# Patient Record
Sex: Male | Born: 1975
Health system: Southern US, Community
[De-identification: ages and names within clinical notes are randomized; demographics above are authoritative.]

## PROBLEM LIST (undated history)

## (undated) DIAGNOSIS — R7989 Other specified abnormal findings of blood chemistry: Secondary | ICD-10-CM

## (undated) DIAGNOSIS — Z87891 Personal history of nicotine dependence: Secondary | ICD-10-CM

## (undated) DIAGNOSIS — J45909 Unspecified asthma, uncomplicated: Secondary | ICD-10-CM

## (undated) DIAGNOSIS — K760 Fatty (change of) liver, not elsewhere classified: Secondary | ICD-10-CM

## (undated) DIAGNOSIS — R945 Abnormal results of liver function studies: Secondary | ICD-10-CM

## (undated) DIAGNOSIS — E785 Hyperlipidemia, unspecified: Secondary | ICD-10-CM

## (undated) HISTORY — DX: Personal history of nicotine dependence: Z87.891

## (undated) HISTORY — DX: Unspecified asthma, uncomplicated: J45.909

## (undated) HISTORY — DX: Hyperlipidemia, unspecified: E78.5

## (undated) HISTORY — DX: Other specified abnormal findings of blood chemistry: R79.89

## (undated) HISTORY — DX: Abnormal results of liver function studies: R94.5

## (undated) HISTORY — DX: Fatty (change of) liver, not elsewhere classified: K76.0

---

## 2017-09-01 ENCOUNTER — Encounter (HOSPITAL_COMMUNITY)
Admission: EM | Disposition: A | Discharge status: Home / Self Care | Payer: Self-pay | Source: Home / Self Care | Attending: Emergency Medicine

## 2017-09-01 ENCOUNTER — Emergency Department (HOSPITAL_COMMUNITY): Payer: Commercial Managed Care - HMO | Admitting: Anesthesiology

## 2017-09-01 ENCOUNTER — Emergency Department (HOSPITAL_COMMUNITY): Payer: Commercial Managed Care - HMO

## 2017-09-01 ENCOUNTER — Observation Stay (HOSPITAL_COMMUNITY)
Admission: EM | Admit: 2017-09-01 | Discharge: 2017-09-02 | Disposition: A | Discharge status: Home / Self Care | Payer: Commercial Managed Care - HMO | Attending: Surgery | Admitting: Surgery

## 2017-09-01 ENCOUNTER — Encounter (HOSPITAL_COMMUNITY): Payer: Self-pay | Admitting: Emergency Medicine

## 2017-09-01 DIAGNOSIS — K3580 Unspecified acute appendicitis: Principal | ICD-10-CM | POA: Insufficient documentation

## 2017-09-01 DIAGNOSIS — Z79899 Other long term (current) drug therapy: Secondary | ICD-10-CM | POA: Diagnosis not present

## 2017-09-01 HISTORY — PX: LAPAROSCOPIC APPENDECTOMY: SHX408

## 2017-09-01 LAB — URINALYSIS, ROUTINE W REFLEX MICROSCOPIC
BILIRUBIN URINE: NEGATIVE
Glucose, UA: NEGATIVE mg/dL
HGB URINE DIPSTICK: NEGATIVE
Ketones, ur: 20 mg/dL — AB
Leukocytes, UA: NEGATIVE
Nitrite: NEGATIVE
Protein, ur: NEGATIVE mg/dL
pH: 7 (ref 5.0–8.0)

## 2017-09-01 LAB — COMPREHENSIVE METABOLIC PANEL
ALBUMIN: 4 g/dL (ref 3.5–5.0)
ALK PHOS: 58 U/L (ref 38–126)
ALT: 34 U/L (ref 0–44)
AST: 26 U/L (ref 15–41)
Anion gap: 12 (ref 5–15)
BUN: 14 mg/dL (ref 6–20)
CALCIUM: 9.2 mg/dL (ref 8.9–10.3)
CO2: 24 mmol/L (ref 22–32)
CREATININE: 1.19 mg/dL (ref 0.61–1.24)
Chloride: 102 mmol/L (ref 98–111)
GFR calc non Af Amer: >60 mL/min (ref >60)
GLUCOSE: 101 mg/dL — AB (ref 70–99)
Potassium: 3.8 mmol/L (ref 3.5–5.1)
SODIUM: 138 mmol/L (ref 135–145)
Total Bilirubin: 1.8 mg/dL — ABNORMAL HIGH (ref 0.3–1.2)
Total Protein: 6.9 g/dL (ref 6.5–8.1)

## 2017-09-01 LAB — CBC
HCT: 50.4 % (ref 39.0–52.0)
HEMOGLOBIN: 16.9 g/dL (ref 13.0–17.0)
MCH: 32.1 pg (ref 26.0–34.0)
MCHC: 33.5 g/dL (ref 30.0–36.0)
MCV: 95.6 fL (ref 78.0–100.0)
PLATELETS: 277 10*3/uL (ref 150–400)
RBC: 5.27 MIL/uL (ref 4.22–5.81)
RDW: 12.9 % (ref 11.5–15.5)
WBC: 10.1 10*3/uL (ref 4.0–10.5)

## 2017-09-01 LAB — LIPASE, BLOOD: Lipase: 27 U/L (ref 11–51)

## 2017-09-01 SURGERY — APPENDECTOMY, LAPAROSCOPIC
Anesthesia: General

## 2017-09-01 MED ORDER — SUCCINYLCHOLINE CHLORIDE 20 MG/ML IJ SOLN
INTRAMUSCULAR | Status: DC | PRN
  Administered 2017-09-01: 80 mg via INTRAVENOUS

## 2017-09-01 MED ORDER — SODIUM CHLORIDE 0.9 % IR SOLN
Status: DC | PRN
  Administered 2017-09-01: 1000 mL

## 2017-09-01 MED ORDER — SODIUM CHLORIDE 0.9 % IV SOLN
2.0000 g | Freq: Once | INTRAVENOUS | Status: AC
  Administered 2017-09-01: 2 g via INTRAVENOUS
  Filled 2017-09-01: qty 20

## 2017-09-01 MED ORDER — FENTANYL CITRATE (PF) 250 MCG/5ML IJ SOLN
INTRAMUSCULAR | Status: DC | PRN
  Administered 2017-09-01: 100 ug via INTRAVENOUS
  Administered 2017-09-01: 50 ug via INTRAVENOUS
  Administered 2017-09-02: 100 ug via INTRAVENOUS

## 2017-09-01 MED ORDER — PROPOFOL 10 MG/ML IV BOLUS
INTRAVENOUS | Status: DC | PRN
  Administered 2017-09-01: 160 mg via INTRAVENOUS
  Administered 2017-09-01: 40 mg via INTRAVENOUS

## 2017-09-01 MED ORDER — ROCURONIUM BROMIDE 100 MG/10ML IV SOLN
INTRAVENOUS | Status: DC | PRN
  Administered 2017-09-01: 40 mg via INTRAVENOUS
  Administered 2017-09-01: 10 mg via INTRAVENOUS

## 2017-09-01 MED ORDER — 0.9 % SODIUM CHLORIDE (POUR BTL) OPTIME
TOPICAL | Status: DC | PRN
  Administered 2017-09-01: 1000 mL

## 2017-09-01 MED ORDER — BUPIVACAINE-EPINEPHRINE (PF) 0.25% -1:200000 IJ SOLN
INTRAMUSCULAR | Status: AC
  Filled 2017-09-01: qty 30

## 2017-09-01 MED ORDER — MIDAZOLAM HCL 5 MG/5ML IJ SOLN
INTRAMUSCULAR | Status: DC | PRN
  Administered 2017-09-01: 2 mg via INTRAVENOUS

## 2017-09-01 MED ORDER — BUPIVACAINE-EPINEPHRINE 0.25% -1:200000 IJ SOLN
INTRAMUSCULAR | Status: DC | PRN
  Administered 2017-09-01: 10 mL

## 2017-09-01 MED ORDER — STERILE WATER FOR IRRIGATION IR SOLN
Status: DC | PRN
  Administered 2017-09-01: 1000 mL

## 2017-09-01 MED ORDER — LIDOCAINE HCL (CARDIAC) PF 100 MG/5ML IV SOSY
PREFILLED_SYRINGE | INTRAVENOUS | Status: DC | PRN
  Administered 2017-09-01: 60 mg via INTRATRACHEAL

## 2017-09-01 MED ORDER — ACETAMINOPHEN 500 MG PO TABS
1000.0000 mg | ORAL_TABLET | Freq: Once | ORAL | Status: AC
  Administered 2017-09-01: 1000 mg via ORAL

## 2017-09-01 MED ORDER — METRONIDAZOLE IN NACL 5-0.79 MG/ML-% IV SOLN
500.0000 mg | Freq: Once | INTRAVENOUS | Status: AC
Start: 2017-09-01 — End: 2017-09-01
  Administered 2017-09-01: 500 mg via INTRAVENOUS
  Filled 2017-09-01: qty 100

## 2017-09-01 MED ORDER — ACETAMINOPHEN 500 MG PO TABS
1000.0000 mg | ORAL_TABLET | Freq: Once | ORAL | Status: DC
  Filled 2017-09-01: qty 2

## 2017-09-01 MED ORDER — LACTATED RINGERS IV SOLN
INTRAVENOUS | Status: DC | PRN
  Administered 2017-09-01 – 2017-09-02 (×2): via INTRAVENOUS

## 2017-09-01 MED ORDER — FENTANYL CITRATE (PF) 250 MCG/5ML IJ SOLN
INTRAMUSCULAR | Status: AC
  Filled 2017-09-01: qty 5

## 2017-09-01 MED ORDER — IOHEXOL 300 MG/ML  SOLN
100.0000 mL | Freq: Once | INTRAMUSCULAR | Status: AC | PRN
  Administered 2017-09-01: 100 mL via INTRAVENOUS

## 2017-09-01 MED ORDER — SODIUM CHLORIDE 0.9 % IV BOLUS
1000.0000 mL | Freq: Once | INTRAVENOUS | Status: AC
Start: 2017-09-01 — End: 2017-09-01
  Administered 2017-09-01: 1000 mL via INTRAVENOUS

## 2017-09-01 MED ORDER — MIDAZOLAM HCL 2 MG/2ML IJ SOLN
INTRAMUSCULAR | Status: AC
  Filled 2017-09-01: qty 2

## 2017-09-01 MED ORDER — ACETAMINOPHEN 325 MG PO TABS: 325.0000 mg | ORAL_TABLET | Freq: Once | ORAL | Status: DC

## 2017-09-01 SURGICAL SUPPLY — 46 items
APPLIER CLIP ROT 10 11.4 M/L (STAPLE)
BENZOIN TINCTURE PRP APPL 2/3 (GAUZE/BANDAGES/DRESSINGS) ×3 IMPLANT
BLADE CLIPPER SURG (BLADE) IMPLANT
CANISTER SUCT 3000ML PPV (MISCELLANEOUS) IMPLANT
CHLORAPREP W/TINT 26ML (MISCELLANEOUS) ×3 IMPLANT
CLIP APPLIE ROT 10 11.4 M/L (STAPLE) IMPLANT
CLOSURE STERI-STRIP 1/2X4 (GAUZE/BANDAGES/DRESSINGS) ×1
CLOSURE WOUND 1/2 X4 (GAUZE/BANDAGES/DRESSINGS) ×1
CLSR STERI-STRIP ANTIMIC 1/2X4 (GAUZE/BANDAGES/DRESSINGS) ×2 IMPLANT
COVER SURGICAL LIGHT HANDLE (MISCELLANEOUS) ×3 IMPLANT
CUTTER FLEX LINEAR 45M (STAPLE) ×3 IMPLANT
DRSG TEGADERM 2-3/8X2-3/4 SM (GAUZE/BANDAGES/DRESSINGS) ×6 IMPLANT
DRSG TEGADERM 4X4.75 (GAUZE/BANDAGES/DRESSINGS) ×3 IMPLANT
ELECT REM PT RETURN 9FT ADLT (ELECTROSURGICAL) ×3
ELECTRODE REM PT RTRN 9FT ADLT (ELECTROSURGICAL) ×1 IMPLANT
ENDOLOOP SUT PDS II  0 18 (SUTURE)
ENDOLOOP SUT PDS II 0 18 (SUTURE) IMPLANT
FILTER SMOKE EVAC LAPAROSHD (FILTER) IMPLANT
GAUZE SPONGE 2X2 8PLY STRL LF (GAUZE/BANDAGES/DRESSINGS) ×1 IMPLANT
GLOVE BIO SURGEON STRL SZ7 (GLOVE) ×3 IMPLANT
GLOVE BIOGEL PI IND STRL 7.5 (GLOVE) ×1 IMPLANT
GLOVE BIOGEL PI INDICATOR 7.5 (GLOVE) ×2
GOWN STRL REUS W/ TWL LRG LVL3 (GOWN DISPOSABLE) ×3 IMPLANT
GOWN STRL REUS W/TWL LRG LVL3 (GOWN DISPOSABLE) ×6
HEMOSTAT SNOW SURGICEL 2X4 (HEMOSTASIS) ×3 IMPLANT
KIT BASIN OR (CUSTOM PROCEDURE TRAY) ×3 IMPLANT
KIT TURNOVER KIT B (KITS) ×3 IMPLANT
NS IRRIG 1000ML POUR BTL (IV SOLUTION) ×3 IMPLANT
PAD ARMBOARD 7.5X6 YLW CONV (MISCELLANEOUS) ×6 IMPLANT
POUCH SPECIMEN RETRIEVAL 10MM (ENDOMECHANICALS) ×3 IMPLANT
RELOAD STAPLE TA45 3.5 REG BLU (ENDOMECHANICALS) ×3 IMPLANT
SCISSORS ENDO CVD 5DCS (MISCELLANEOUS) IMPLANT
SET IRRIG TUBING LAPAROSCOPIC (IRRIGATION / IRRIGATOR) IMPLANT
SHEARS HARMONIC ACE PLUS 36CM (ENDOMECHANICALS) ×3 IMPLANT
SLEEVE ENDOPATH XCEL 5M (ENDOMECHANICALS) ×3 IMPLANT
SPECIMEN JAR SMALL (MISCELLANEOUS) ×3 IMPLANT
SPONGE GAUZE 2X2 STER 10/PKG (GAUZE/BANDAGES/DRESSINGS) ×2
STRIP CLOSURE SKIN 1/2X4 (GAUZE/BANDAGES/DRESSINGS) ×2 IMPLANT
SUT MNCRL AB 4-0 PS2 18 (SUTURE) ×3 IMPLANT
TOWEL OR 17X24 6PK STRL BLUE (TOWEL DISPOSABLE) ×3 IMPLANT
TOWEL OR 17X26 10 PK STRL BLUE (TOWEL DISPOSABLE) ×3 IMPLANT
TRAY LAPAROSCOPIC MC (CUSTOM PROCEDURE TRAY) ×3 IMPLANT
TROCAR XCEL BLUNT TIP 100MML (ENDOMECHANICALS) ×3 IMPLANT
TROCAR XCEL NON-BLD 5MMX100MML (ENDOMECHANICALS) ×3 IMPLANT
TUBING INSUFFLATION (TUBING) ×3 IMPLANT
WATER STERILE IRR 1000ML POUR (IV SOLUTION) ×3 IMPLANT

## 2017-09-01 NOTE — ED Triage Notes (Signed)
Patient complains of sharp right sided abdominal pain, emesis x2 days. Sent to ED from Urgent Care for concern of appendicitis. Patient alert, oriented, and in no apparent distress at this time.

## 2017-09-01 NOTE — ED Notes (Signed)
First surgeon said nothing by mouth but then he said he could have the tylenol.

## 2017-09-01 NOTE — ED Provider Notes (Signed)
[facility] Provider Note  CSN: [id number] Arrival date & time: 09/01/17  1348    History   Chief Complaint Chief Complaint  Patient presents with  . Abdominal Pain    HPI David Kent is a 42 y.o. male with no significant medical history who presented to the ED for abdominal pain x1 day. Describes acute onset sharp RLQ pain that has been constant. He cannot identify any aggravating or relieving symptoms. Endorses chills, diarrhea and vomiting which began 2 days ago before the abdominal pain. He denies any GI problems or surgeries in the past. Denies melena/hematochezia, constipation, unexpected weight change, flank pain, hematuria, dysuria, urgency or frequency.  History reviewed. No pertinent past medical history.  There are no active problems to display for this patient.   History reviewed. No pertinent surgical history.    Home Medications    Prior to Admission medications   Not on File    Family History No family history on file.  Social History Social History   Tobacco Use  . Smoking status: Not on file  Substance Use Topics  . Alcohol use: Not on file  . Drug use: Not on file    Allergies   Patient has no known allergies.   Review of Systems Review of Systems  Constitutional: Positive for chills. Negative for fever.  Respiratory: Negative.   Cardiovascular: Negative.   Gastrointestinal: Positive for abdominal pain, diarrhea, nausea and vomiting. Negative for blood in stool and constipation.  Genitourinary: Negative for decreased urine volume, difficulty urinating, dysuria, flank pain, frequency, hematuria and urgency.  Musculoskeletal: Negative.   Skin: Negative.   Neurological: Negative.   Hematological: Negative.   Psychiatric/Behavioral: Negative.    Physical Exam Updated Vital Signs BP (!) 126/91 (BP Location: Right Arm)   Pulse 75   Temp 98.6 F (37 C) (Oral)   Resp 14   SpO2 97%   Physical Exam    Constitutional: Vital signs are normal. He appears well-developed and well-nourished. He is cooperative.  Non-toxic appearance.  Cardiovascular: Normal rate, regular rhythm and normal heart sounds.  No murmur heard. Pulmonary/Chest: Effort normal and breath sounds normal.  Abdominal: Soft. Normal appearance and bowel sounds are normal. There is tenderness in the right lower quadrant. There is guarding and tenderness at McBurney's point.  Musculoskeletal: Normal range of motion.  Neurological: He is alert.  Skin: Skin is warm and intact. Capillary refill takes less than 2 seconds. He is not diaphoretic. No pallor.  Nursing note and vitals reviewed.  ED Treatments / Results  Labs (all labs ordered are listed, but only abnormal results are displayed) Labs Reviewed  COMPREHENSIVE METABOLIC PANEL - Abnormal; Notable for the following components:      Result Value   Glucose, Bld 101 (*)    Total Bilirubin 1.8 (*)    All other components within normal limits  LIPASE, BLOOD  CBC  URINALYSIS, ROUTINE W REFLEX MICROSCOPIC    EKG None  Radiology Ct Abdomen Pelvis W Contrast  Result Date: 09/01/2017 CLINICAL DATA:  Right flank pain EXAM: CT ABDOMEN AND PELVIS WITH CONTRAST TECHNIQUE: Multidetector CT imaging of the abdomen and pelvis was performed using the standard protocol following bolus administration of intravenous contrast. CONTRAST:  100mL OMNIPAQUE IOHEXOL 300 MG/ML  SOLN COMPARISON:  None. FINDINGS: Lower chest: Lung bases are clear. No effusions. Heart is normal size. Hepatobiliary: Diffuse low-density throughout the liver compatible with fatty infiltration. No focal abnormality. Gallbladder unremarkable. Pancreas: No focal abnormality or  ductal dilatation. Spleen: No focal abnormality.  Normal size. Adrenals/Urinary Tract: No adrenal abnormality. No focal renal abnormality. No stones or hydronephrosis. Urinary bladder is unremarkable. Stomach/Bowel: Dilated, inflamed appendix measuring  up to 14 mm with surrounding inflammation. Findings compatible with acute appendicitis. Appendix is retrocecal. Stomach, large and small bowel grossly unremarkable. Vascular/Lymphatic: No evidence of aneurysm or adenopathy. Reproductive: No visible focal abnormality. Other: No free fluid or free air. Musculoskeletal: No acute bony abnormality. IMPRESSION: Dilated and inflamed retrocecal appendix compatible with acute appendicitis. Fatty infiltration of the liver. Electronically Signed   By: Charlett NoseKevin  Dover M.D.   On: 09/01/2017 19:50    Procedures Procedures (including critical care time)  Medications Ordered in ED Medications  cefTRIAXone (ROCEPHIN) 2 g in sodium chloride 0.9 % 100 mL IVPB (has no administration in time range)    And  metroNIDAZOLE (FLAGYL) IVPB 500 mg (has no administration in time range)  sodium chloride 0.9 % bolus 1,000 mL (has no administration in time range)  iohexol (OMNIPAQUE) 300 MG/ML solution 100 mL (100 mLs Intravenous Contrast Given 09/01/17 1922)    Initial Impression / Assessment and Plan / ED Course  Triage vital signs and the nursing notes have been reviewed.  Pertinent labs & imaging results that were available during care of the patient were reviewed and considered in medical decision making (see chart for details).  Patient presents afebrile and with normal vital signs for RLQ abdominal pain. On physical exam, patient endorsed abdominal pain in his RLQ with some guarding, but had no other significant findings. Patient is well appearing and resting comfortably in the exam room. History is concerning for appendicitis.  Clinical Course as of Sep 02 2110  Thu Sep 01, 2017  1845 Labs unremarkable. No metabolic abnormalities. No elevated WBC   [GM]  2056 CT scan consistent with acute appendicitis. No free fluid or air in abdomen. No perforation. Consult placed to general surgery for admission. Ordered IV Rocephin and Flagyl for empiric antibiotic coverage.     [GM]  2109 Case discussed with general surgeon, Dr. Corliss Skainssuei, who will admit patient.   [GM]    Clinical Course User Index [GM] Anthony Roland, Sharyon MedicusGabrielle I, PA-C   Final Clinical Impressions(s) / ED Diagnoses  1. Acute Appendicitis. IV Rocephin and Flagyl initiated in the ED. Case discussed with general surgery who will admit.  Dispo: Admit.   Final diagnoses:  Acute appendicitis, unspecified acute appendicitis type    ED Discharge Orders    None        Reva BoresMortis, Irlene Crudup I, PA-C 09/01/17 2112    Pricilla LovelessGoldston, Scott, MD 09/01/17 2245

## 2017-09-01 NOTE — H&P (Signed)
David Kent is an 42 y.o. male.   Chief Complaint: RLQ pain HPI: 42 yo male in good health presents with one day of RLQ abdominal pain and two days of chills, diarrhea, and vomiting.  No fever.  Presented to ED for evaluation and was found to have appendicitis.  History reviewed. No pertinent past medical history.  History reviewed. No pertinent surgical history.  No family history on file. Social History:  has no tobacco, alcohol, and drug history on file.  Allergies: No Known Allergies  Prior to Admission medications   Medication Sig Start Date End Date Taking? Authorizing Provider  Cholecalciferol (VITAMIN D PO) Take 1 tablet by mouth daily.   Yes [provider]  FIBER ADULT GUMMIES PO Take 1 tablet by mouth daily.   Yes [provider]  Multiple Vitamin (MULTIVITAMIN WITH MINERALS) TABS tablet Take 1 tablet by mouth daily.   Yes [provider]  omega-3 acid ethyl esters (LOVAZA) 1 g capsule Take 1 g by mouth daily.   Yes [provider]     Results for orders placed or performed during the hospital encounter of 09/01/17 (from the past 48 hour(s))  Lipase, blood     Status: None   Collection Time: 09/01/17  2:55 PM  Result Value Ref Range   Lipase 27 11 - 51 U/L    Comment: Performed at [facility], [place] [address] [place], [place] [address]  Comprehensive metabolic panel     Status: Abnormal   Collection Time: 09/01/17  2:55 PM  Result Value Ref Range   Sodium 138 135 - 145 mmol/L   Potassium 3.8 3.5 - 5.1 mmol/L   Chloride 102 98 - 111 mmol/L   CO2 24 22 - 32 mmol/L   Glucose, Bld 101 (H) 70 - 99 mg/dL   BUN 14 6 - 20 mg/dL   Creatinine, Ser 1.19 0.61 - 1.24 mg/dL   Calcium 9.2 8.9 - 10.3 mg/dL   Total Protein 6.9 6.5 - 8.1 g/dL   Albumin 4.0 3.5 - 5.0 g/dL   AST 26 15 - 41 U/L   ALT 34 0 - 44 U/L   Alkaline Phosphatase 58 38 - 126 U/L   Total Bilirubin 1.8 (H) 0.3 - 1.2 mg/dL   GFR calc non Af Amer >60 >60 mL/min   GFR  calc Af Amer >60 >60 mL/min    Comment: (NOTE) The eGFR has been calculated using the CKD EPI equation. This calculation has not been validated in all clinical situations. eGFR's persistently <60 mL/min signify possible Chronic Kidney Disease.    Anion gap 12 5 - 15    Comment: Performed at [facility] [address], [place] [address]  CBC     Status: None   Collection Time: 09/01/17  2:55 PM  Result Value Ref Range   WBC 10.1 4.0 - 10.5 K/uL   RBC 5.27 4.22 - 5.81 MIL/uL   Hemoglobin 16.9 13.0 - 17.0 g/dL   HCT 50.4 39.0 - 52.0 %   MCV 95.6 78.0 - 100.0 fL   MCH 32.1 26.0 - 34.0 pg   MCHC 33.5 30.0 - 36.0 g/dL   RDW 12.9 11.5 - 15.5 %   Platelets 277 150 - 400 K/uL    Comment: Performed at [facility] [address] [place], [place] [address]   Ct Abdomen Pelvis W Contrast  Result Date: 09/01/2017 CLINICAL DATA:  Right flank pain EXAM: CT ABDOMEN  AND PELVIS WITH CONTRAST TECHNIQUE: Multidetector CT imaging of the abdomen and pelvis was performed using the standard protocol following bolus administration of intravenous contrast. CONTRAST:  124m OMNIPAQUE IOHEXOL 300 MG/ML  SOLN COMPARISON:  None. FINDINGS: Lower chest: Lung bases are clear. No effusions. Heart is normal size. Hepatobiliary: Diffuse low-density throughout the liver compatible with fatty infiltration. No focal abnormality. Gallbladder unremarkable. Pancreas: No focal abnormality or ductal dilatation. Spleen: No focal abnormality.  Normal size. Adrenals/Urinary Tract: No adrenal abnormality. No focal renal abnormality. No stones or hydronephrosis. Urinary bladder is unremarkable. Stomach/Bowel: Dilated, inflamed appendix measuring up to 14 mm with surrounding inflammation. Findings compatible with acute appendicitis. Appendix is retrocecal. Stomach, large and small bowel grossly unremarkable. Vascular/Lymphatic: No evidence of aneurysm or adenopathy. Reproductive: No visible focal abnormality. Other:  No free fluid or free air. Musculoskeletal: No acute bony abnormality. IMPRESSION: Dilated and inflamed retrocecal appendix compatible with acute appendicitis. Fatty infiltration of the liver. Electronically Signed   By: KRolm BaptiseM.D.   On: 09/01/2017 19:50    Review of Systems  Constitutional: Negative for weight loss.  HENT: Negative for ear discharge, ear pain, hearing loss and tinnitus.   Eyes: Negative for blurred vision, double vision, photophobia and pain.  Respiratory: Negative for cough, sputum production and shortness of breath.   Cardiovascular: Negative for chest pain.  Gastrointestinal: Positive for abdominal pain, diarrhea, nausea and vomiting.  Genitourinary: Negative for dysuria, flank pain, frequency and urgency.  Musculoskeletal: Negative for back pain, falls, joint pain, myalgias and neck pain.  Neurological: Negative for dizziness, tingling, sensory change, focal weakness, loss of consciousness and headaches.  Endo/Heme/Allergies: Does not bruise/bleed easily.  Psychiatric/Behavioral: Negative for depression, memory loss and substance abuse. The patient is not nervous/anxious.     Blood pressure (!) 126/91, pulse 75, temperature 98.6 F (37 C), temperature source Oral, resp. rate 14, SpO2 97 %. Physical Exam  WDWN in NAD Eyes:  Pupils equal, round; sclera anicteric HENT:  Oral mucosa moist; good dentition  Neck:  No masses palpated, no thyromegaly Lungs:  CTA bilaterally; normal respiratory effort CV:  Regular rate and rhythm; no murmurs; extremities well-perfused with no edema Abd:  +bowel sounds, soft, tender in RLQ with some guarding, no palpable organomegaly; no palpable hernias Skin:  Warm, dry; no sign of jaundice Psychiatric - alert and oriented x 4; calm mood and affect  Assessment/Plan Acute appendicitis  - no sign of perforation.  To OR for laparoscopic appendectomy.  The surgical procedure has been discussed with the patient.  Potential risks,  benefits, alternative treatments, and expected outcomes have been explained.  All of the patient's questions at this time have been answered.  The likelihood of reaching the patient's treatment goal is good.  The patient understand the proposed surgical procedure and wishes to proceed.   MMaia Petties MD 09/01/2017, 9:55 PM

## 2017-09-01 NOTE — Anesthesia Preprocedure Evaluation (Signed)
Anesthesia Evaluation  Patient identified by MRN, date of birth, ID band Patient awake    Reviewed: Allergy & Precautions, NPO status , Patient's Chart, lab work & pertinent test results  Airway Mallampati: III  TM Distance: >3 FB Neck ROM: Full    Dental  (+) Teeth Intact   Pulmonary neg pulmonary ROS,    breath sounds clear to auscultation       Cardiovascular negative cardio ROS   Rhythm:Regular     Neuro/Psych negative neurological ROS  negative psych ROS   GI/Hepatic Neg liver ROS, appendicitis   Endo/Other  negative endocrine ROS  Renal/GU negative Renal ROS     Musculoskeletal negative musculoskeletal ROS (+)   Abdominal   Peds  Hematology negative hematology ROS (+)   Anesthesia Other Findings   Reproductive/Obstetrics                             Anesthesia Physical Anesthesia Plan  ASA: I  Anesthesia Plan: General   Post-op Pain Management:    Induction: Intravenous, Rapid sequence and Cricoid pressure planned  PONV Risk Score and Plan: 2 and Ondansetron and Dexamethasone  Airway Management Planned: Oral ETT  Additional Equipment: None  Intra-op Plan:   Post-operative Plan: Extubation in OR  Informed Consent: I have reviewed the patients History and Physical, chart, labs and discussed the procedure including the risks, benefits and alternatives for the proposed anesthesia with the patient or authorized representative who has indicated his/her understanding and acceptance.   Dental advisory given  Plan Discussed with: CRNA and Surgeon  Anesthesia Plan Comments:         Anesthesia Quick Evaluation

## 2017-09-01 NOTE — Anesthesia Procedure Notes (Signed)
Procedure Name: Intubation Date/Time: 09/01/2017 11:51 PM Performed by: Claudina LickMahony, Nigil Braman D, CRNA Pre-anesthesia Checklist: Patient identified, Emergency Drugs available, Suction available, Patient being monitored and Timeout performed Patient Re-evaluated:Patient Re-evaluated prior to induction Oxygen Delivery Method: Circle system utilized Preoxygenation: Pre-oxygenation with 100% oxygen Induction Type: IV induction, Rapid sequence and Cricoid Pressure applied Laryngoscope Size: Miller and 2 Grade View: Grade I Tube type: Oral Tube size: 7.5 mm Number of attempts: 2 (First DL- Pt coughing.  Second DL grade 1 view, oropharynx clear.) Airway Equipment and Method: Stylet Placement Confirmation: ETT inserted through vocal cords under direct vision,  positive ETCO2 and breath sounds checked- equal and bilateral Secured at: 22 cm Tube secured with: Tape Dental Injury: Teeth and Oropharynx as per pre-operative assessment

## 2017-09-02 ENCOUNTER — Other Ambulatory Visit: Payer: Self-pay

## 2017-09-02 ENCOUNTER — Encounter (HOSPITAL_COMMUNITY): Payer: Self-pay | Admitting: *Deleted

## 2017-09-02 DIAGNOSIS — K3580 Unspecified acute appendicitis: Secondary | ICD-10-CM | POA: Diagnosis present

## 2017-09-02 MED ORDER — METRONIDAZOLE IN NACL 5-0.79 MG/ML-% IV SOLN
500.0000 mg | Freq: Three times a day (TID) | INTRAVENOUS | Status: DC
  Administered 2017-09-02: 500 mg via INTRAVENOUS
  Filled 2017-09-02 (×2): qty 100

## 2017-09-02 MED ORDER — OXYCODONE HCL 5 MG PO TABS: 5.0000 mg | ORAL_TABLET | Freq: Once | ORAL | Status: DC | PRN

## 2017-09-02 MED ORDER — ONDANSETRON HCL 4 MG/2ML IJ SOLN
INTRAMUSCULAR | Status: DC | PRN
  Administered 2017-09-02: 4 mg via INTRAVENOUS

## 2017-09-02 MED ORDER — FENTANYL CITRATE (PF) 100 MCG/2ML IJ SOLN
INTRAMUSCULAR | Status: AC
  Filled 2017-09-02: qty 2

## 2017-09-02 MED ORDER — OXYCODONE HCL 5 MG PO TABS: 5.0000 mg | ORAL_TABLET | ORAL | 0 refills | Status: DC | PRN

## 2017-09-02 MED ORDER — DEXAMETHASONE SODIUM PHOSPHATE 10 MG/ML IJ SOLN
INTRAMUSCULAR | Status: DC | PRN
  Administered 2017-09-02: 10 mg via INTRAVENOUS

## 2017-09-02 MED ORDER — MORPHINE SULFATE (PF) 2 MG/ML IV SOLN: 2.0000 mg | INTRAVENOUS | Status: DC | PRN

## 2017-09-02 MED ORDER — ONDANSETRON HCL 4 MG/2ML IJ SOLN: 4.0000 mg | Freq: Four times a day (QID) | INTRAMUSCULAR | Status: DC | PRN

## 2017-09-02 MED ORDER — DIPHENHYDRAMINE HCL 25 MG PO CAPS: 25.0000 mg | ORAL_CAPSULE | Freq: Four times a day (QID) | ORAL | Status: DC | PRN

## 2017-09-02 MED ORDER — ACETAMINOPHEN 500 MG PO TABS: 1000.0000 mg | ORAL_TABLET | Freq: Four times a day (QID) | ORAL | 0 refills | Status: DC | PRN

## 2017-09-02 MED ORDER — SODIUM CHLORIDE 0.9 % IV SOLN: 2.0000 g | INTRAVENOUS | Status: DC

## 2017-09-02 MED ORDER — OXYCODONE HCL 5 MG PO TABS
5.0000 mg | ORAL_TABLET | ORAL | Status: DC | PRN
  Administered 2017-09-02: 10 mg via ORAL
  Filled 2017-09-02: qty 2

## 2017-09-02 MED ORDER — TRAMADOL HCL 50 MG PO TABS: 50.0000 mg | ORAL_TABLET | Freq: Four times a day (QID) | ORAL | Status: DC | PRN

## 2017-09-02 MED ORDER — OXYCODONE HCL 5 MG/5ML PO SOLN: 5.0000 mg | Freq: Once | ORAL | Status: DC | PRN

## 2017-09-02 MED ORDER — IBUPROFEN 200 MG PO TABS: 200.0000 mg | ORAL_TABLET | Freq: Three times a day (TID) | ORAL | 0 refills | Status: DC | PRN

## 2017-09-02 MED ORDER — FENTANYL CITRATE (PF) 100 MCG/2ML IJ SOLN
25.0000 ug | INTRAMUSCULAR | Status: DC | PRN
  Administered 2017-09-02 (×2): 50 ug via INTRAVENOUS

## 2017-09-02 MED ORDER — POTASSIUM CHLORIDE IN NACL 20-0.9 MEQ/L-% IV SOLN
INTRAVENOUS | Status: DC
  Administered 2017-09-02: 02:00:00 via INTRAVENOUS
  Filled 2017-09-02: qty 1000

## 2017-09-02 MED ORDER — GABAPENTIN 300 MG PO CAPS
300.0000 mg | ORAL_CAPSULE | Freq: Two times a day (BID) | ORAL | Status: DC
  Administered 2017-09-02 (×2): 300 mg via ORAL
  Filled 2017-09-02 (×2): qty 1

## 2017-09-02 MED ORDER — SUGAMMADEX SODIUM 200 MG/2ML IV SOLN
INTRAVENOUS | Status: DC | PRN
  Administered 2017-09-02: 300 mg via INTRAVENOUS

## 2017-09-02 MED ORDER — DIPHENHYDRAMINE HCL 50 MG/ML IJ SOLN: 25.0000 mg | Freq: Four times a day (QID) | INTRAMUSCULAR | Status: DC | PRN

## 2017-09-02 MED ORDER — ACETAMINOPHEN 500 MG PO TABS
1000.0000 mg | ORAL_TABLET | Freq: Four times a day (QID) | ORAL | Status: DC
  Administered 2017-09-02: 1000 mg via ORAL
  Filled 2017-09-02: qty 2

## 2017-09-02 MED ORDER — ONDANSETRON 4 MG PO TBDP: 4.0000 mg | ORAL_TABLET | Freq: Four times a day (QID) | ORAL | Status: DC | PRN

## 2017-09-02 NOTE — Transfer of Care (Signed)
Immediate Anesthesia Transfer of Care Note  Patient: David Kent  Procedure(s) Performed: APPENDECTOMY LAPAROSCOPIC (N/A )  Patient Location: PACU  Anesthesia Type:General  Level of Consciousness: awake  Airway & Oxygen Therapy: Patient Spontanous Breathing  Post-op Assessment: Report given to RN and Post -op Vital signs reviewed and stable  Post vital signs: Reviewed and stable  Last Vitals:  Vitals Value Taken Time  BP    Temp    Pulse 87 09/02/2017 12:43 AM  Resp    SpO2 96 % 09/02/2017 12:43 AM  Vitals shown include unvalidated device data.  Last Pain:  Vitals:   09/01/17 1738  TempSrc:   PainSc: 5          Complications: No apparent anesthesia complications

## 2017-09-02 NOTE — Progress Notes (Addendum)
Received patient from PACU, AOx4, VSS, O2Sat at 93% on RA, pain at 4/10.  Patient oriented to room, bed controls and call light and gave ice water to drink, then patient ambulated to bathroom to void.  Gave scheduled tylenol and neurontin.  Patient now lying on bed comfortably watching TV.  Will monitor.

## 2017-09-02 NOTE — Progress Notes (Signed)
Discharge home. Home discharge instruction given, no question verbalized. 

## 2017-09-02 NOTE — Op Note (Signed)
Appendectomy, Lap, Procedure Note  Indications: The patient presented with a history of right-sided abdominal pain. A CT scan revealed findings consistent with acute appendicitis.  Pre-operative Diagnosis: Acute appendicitis without mention of peritonitis  Post-operative Diagnosis: Same  Surgeon: Wynona LunaMatthew K Galia Rahm   Assistants: none  Anesthesia: General endotracheal anesthesia  ASA Class: 1E  Procedure Details  The patient was seen again in the Holding Room. The risks, benefits, complications, treatment options, and expected outcomes were discussed with the patient and/or family. The possibilities of reaction to medication, perforation of viscus, bleeding, recurrent infection, finding a normal appendix, the need for additional procedures, failure to diagnose a condition, and creating a complication requiring transfusion or operation were discussed. There was concurrence with the proposed plan and informed consent was obtained. The site of surgery was properly noted. The patient was taken to Operating Room, [facility] and the procedure verified as Appendectomy. A Time Out was held and the above information confirmed.  The patient was placed in the supine position and general anesthesia was induced.  The abdomen was prepped and draped in a sterile fashion. A one centimeter supraumbilical incision was made.  Dissection was carried down to the fascia bluntly.  The fascia was incised vertically.  We entered the peritoneal cavity bluntly.  A pursestring suture was passed around the incision with a 0 Vicryl.  The Hasson cannula was introduced into the abdomen and the tails of the suture were used to hold the Hasson in place.   The pneumoperitoneum was then established maintaining a maximum pressure of 15 mmHg.  Additional 5 mm cannulas then placed in the left lower quadrant of the abdomen and the right upper quadrant under direct visualization. A careful evaluation of the entire abdomen  was carried out. The patient was placed in Trendelenburg and left lateral decubitus position.  The scope was moved to the right upper quadrant port site. The cecum was mobilized medially.  The appendix was in a retrocecal location.  There was significant inflammation and some fibrinous exudate, but no abscess and no sign of perforation.  The appendix was carefully dissected. The appendix was was skeletonized with the harmonic scalpel.   The appendix was divided at its base using an endo-GIA stapler. Minimal appendiceal stump was left in place. A small bleeding vessel was controlled with a Ligaclip and Surgicel.  There was no further evidence of bleeding, leakage, or complication after division of the appendix. Irrigation was also performed and irrigate suctioned from the abdomen as well.  The umbilical port site was closed with the purse string suture. There was no residual palpable fascial defect.  The trocar site skin wounds were closed with 4-0 Monocryl.  Instrument, sponge, and needle counts were correct at the conclusion of the case.   Findings: The appendix was found to be inflamed. There were not signs of necrosis.  There was not perforation. There was not abscess formation.  Estimated Blood Loss:  less than 50 mL         Drains: none         Specimens: appendix         Complications:  None; patient tolerated the procedure well.         Disposition: PACU - hemodynamically stable.         Condition: stable  Wilmon ArmsMatthew K. Corliss Skainssuei, MD, [facility]  General/ Trauma Surgery  09/02/2017 12:35 AM

## 2017-09-02 NOTE — Discharge Summary (Signed)
     Patient ID: David BattyLuis Hudgins [id number] 05/04/1975 42 y.o.  Admit date: 09/01/2017 Discharge date: 09/02/2017  Admitting Diagnosis: Acute appendicitis   Discharge Diagnosis Patient Active Problem List   Diagnosis Date Noted  . Acute appendicitis 09/02/2017    Consultants none  Reason for Admission: 42 yo male in good health presents with one day of RLQ abdominal pain and two days of chills, diarrhea, and vomiting.  No fever.  Presented to ED for evaluation and was found to have appendicitis.  Procedures Lap appy by Dr. Corliss Skainssuei, [facility]8/16  Hospital Course:  The patient was admitted for a lap appy.  He underwent the procedure and tolerated it well.  On POD 0, he was tolerating a regular diet, good pain control, voiding well, and mobilizing well.  He was otherwise stable for DC home.    Physical Exam: Abd: soft, appropriately tender, +BS, incisions c/d/i with steri-strips, gauze, and tegaderm.    Allergies as of 09/02/2017   No Known Allergies     Medication List    TAKE these medications   acetaminophen 500 MG tablet Commonly known as:  TYLENOL Take 2 tablets (1,000 mg total) by mouth every 6 (six) hours as needed.   FIBER ADULT GUMMIES PO Take 1 tablet by mouth daily.   ibuprofen 200 MG tablet Commonly known as:  ADVIL,MOTRIN Take 1-4 tablets (200-800 mg total) by mouth every 8 (eight) hours as needed.   multivitamin with minerals Tabs tablet Take 1 tablet by mouth daily.   omega-3 acid ethyl esters 1 g capsule Commonly known as:  LOVAZA Take 1 g by mouth daily.   oxyCODONE 5 MG immediate release tablet Commonly known as:  Oxy IR/ROXICODONE Take 1 tablet (5 mg total) by mouth every 4 (four) hours as needed for moderate pain.   VITAMIN D PO Take 1 tablet by mouth daily.        Follow-up Information    [facility] 2 week(s).   Specialty:  General Surgery Contact information: [address] [place]  [id number] [phone]           Signed: Barnetta ChapelKelly Hawley Pavia, [facility] 09/02/2017, 7:51 AM Pager: [phone]

## 2017-09-02 NOTE — Anesthesia Postprocedure Evaluation (Signed)
Anesthesia Post Note  Patient: Koi Hinderer  Procedure(s) Performed: APPENDECTOMY LAPAROSCOPIC (N/A )     Patient location during evaluation: PACU Anesthesia Type: General Level of consciousness: awake and alert Pain management: pain level controlled Vital Signs Assessment: post-procedure vital signs reviewed and stable Respiratory status: spontaneous breathing, nonlabored ventilation, respiratory function stable and patient connected to nasal cannula oxygen Cardiovascular status: blood pressure returned to baseline and stable Postop Assessment: no apparent nausea or vomiting Anesthetic complications: no    Last Vitals:  Vitals:   09/02/17 0203 09/02/17 0502  BP: 125/81 131/77  Pulse: 67 (!) 57  Resp: 18 17  Temp: 36.9 C 36.8 C  SpO2: 93% 95%    Last Pain:  Vitals:   09/02/17 0502  TempSrc: Oral  PainSc:                  Maiah Sinning

## 2017-09-02 NOTE — Discharge Instructions (Signed)
Please arrive at least 30 min before your appointment to complete your check in paperwork.  If you are unable to arrive 30 min prior to your appointment time we may have to cancel or reschedule you.  LAPAROSCOPIC SURGERY: POST OP INSTRUCTIONS  1. DIET: Follow a light bland diet the first 24 hours after arrival home, such as soup, liquids, crackers, etc. Be sure to include lots of fluids daily. Avoid fast food or heavy meals as your are more likely to get nauseated. Eat a low fat the next few days after surgery.  2. Take your usually prescribed home medications unless otherwise directed. 3. PAIN CONTROL:  1. Pain is best controlled by a usual combination of three different methods TOGETHER:  i. Ice/Heat ii. Over the counter pain medication iii. Prescription pain medication 2. Most patients will experience some swelling and bruising around the incisions. Ice packs or heating pads (30-60 minutes up to 6 times a day) will help. Use ice for the first few days to help decrease swelling and bruising, then switch to heat to help relax tight/sore spots and speed recovery. Some people prefer to use ice alone, heat alone, alternating between ice & heat. Experiment to what works for you. Swelling and bruising can take several weeks to resolve.  3. It is helpful to take an over-the-counter pain medication regularly for the first few weeks. Choose one of the following that works best for you:  i. Naproxen (Aleve, etc) Two 220mg  tabs twice a day ii. Ibuprofen (Advil, etc) Three 200mg  tabs four times a day (every meal & bedtime) iii. Acetaminophen (Tylenol, etc) 500-650mg  four times a day (every meal & bedtime) 4. A prescription for pain medication (such as oxycodone, hydrocodone, etc) should be given to you upon discharge. Take your pain medication as prescribed.  i. If you are having problems/concerns with the prescription medicine (does not control pain, nausea, vomiting, rash, itching, etc), please call us [phone]  [phone] to see if we need to switch you to a different pain medicine that will work better for you and/or control your side effect better. ii. If you need a refill on your pain medication, please contact your pharmacy. They will contact our office to request authorization. Prescriptions will not be filled after 5 pm or on week-ends. 1. Avoid getting constipated. Between the surgery and the pain medications, it is common to experience some constipation. Increasing fluid intake and taking a fiber supplement (such as Metamucil, Citrucel, FiberCon, MiraLax, etc) 1-2 times a day regularly will usually help prevent this problem from occurring. A mild laxative (prune juice, Milk of Magnesia, MiraLax, etc) should be taken according to package directions if there are no bowel movements after 48 hours.  2. Watch out for diarrhea. If you have many loose bowel movements, simplify your diet to bland foods & liquids for a few days. Stop any stool softeners and decrease your fiber supplement. Switching to mild anti-diarrheal medications (Kayopectate, Pepto Bismol) can help. If this worsens or does not improve, please call us. 3. Wash / shower every day. You may shower over the dressings as they are waterproof. Continue to shower over incision(s) after the dressing is off. 4. Remove your waterproof bandages 5 days after surgery. You may leave the incision open to air. You may replace a dressing/Band-Aid to cover the incision for comfort if you wish.  5. ACTIVITIES as tolerated:  a. You may resume regular (light) daily activities beginning the next day--such as daily self-care, walking, climbing stairs--gradually  increasing activities as tolerated. If you can walk 30 minutes without difficulty, it is safe to try more intense activity such as jogging, treadmill, bicycling, low-impact aerobics, swimming, etc. °b. Save the most intensive and strenuous activity for last such as sit-ups, heavy lifting, contact sports, etc Refrain  from any heavy lifting or straining until you are off narcotics for pain control.  °c. DO NOT PUSH THROUGH PAIN. Let pain be your guide: If it hurts to do something, don't do it. Pain is your body warning you to avoid that activity for another week until the pain goes down. °d. You may drive when you are no longer taking prescription pain medication, you can comfortably wear a seatbelt, and you can safely maneuver your car and apply brakes. °e. You may have sexual intercourse when it is comfortable.  °6. FOLLOW UP in our office  °a. Please call [facility] at [phone] to set up an appointment to see your surgeon in the office for a follow-up appointment approximately 2-3 weeks after your surgery. °b. Make sure that you call for this appointment the day you arrive home to insure a convenient appointment time. °     10. IF YOU HAVE DISABILITY OR FAMILY LEAVE FORMS, BRING THEM TO THE               OFFICE FOR PROCESSING.  ° °WHEN TO CALL US [phone]:  °1. Poor pain control °2. Reactions / problems with new medications (rash/itching, nausea, etc)  °3. Fever over 101.5 F (38.5 C) °4. Inability to urinate °5. Nausea and/or vomiting °6. Worsening swelling or bruising °7. Continued bleeding from incision. °8. Increased pain, redness, or drainage from the incision ° °The clinic staff is available to answer your questions during regular business hours (8:30am-5pm). Please don’t hesitate to call and ask to speak to one of our nurses for clinical concerns.  °If you have a medical emergency, go to the nearest emergency room or call 911.  °A surgeon from [facility] is always on call at the hospitals  ° °[facility]  °[address], [address], [place], [place] [address] ?  °MAIN: [phone] ? TOLL FREE: [phone] ?  °FAX [phone]  °[email or web address] ° °

## 2017-09-02 NOTE — Progress Notes (Signed)
Patient's wallet and phone retrieved from security and returned to patient in PACU.  Witnessed by patient and Publishing copyAngleo Brickhouse RN.

## 2017-09-05 NOTE — Progress Notes (Signed)
Subjective:    Patient ID: David Kent, male    DOB: 07/01/1975, 42 y.o.   MRN: [id number]  HPI Chief Complaint  Patient presents with  . new pt    new pt,physical, cholesterol- had appendix out friday. will come back to get flu shot   He is new to the practice and here for a complete physical exam. [place] 2 [place]  Last CPE: 2 years ago  Other providers: None   Past medical history: History of elevated cholesterol, no medications.  Recent weight gain due to poor diet and no exercise.   Surgeries: recent appendectomy done by Dr. Corliss Skainssuei. States he is doing well but abdomen is still sore.   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  Former smoker and stopped 15 years ago.  Reports he has been drinking more alcohol than usual but is cutting back.  Denies drug use.   Diet: Diet has been poor. Fast food.  Exercise: nothing lately   Immunizations: Tdap unknown   Depression screen [facility] 2[facility] 09/06/2017  Decreased Interest 0  Down, Depressed, Hopeless 0  PHQ - 2 Score 0     Health maintenance:  Colonoscopy: N/A Last PSA: N/A Last Dental Exam: one year ago Last Eye Exam: 2 years ago   Wears seatbelt always, uses sunscreen, smoke detectors in home and functioning, does not text while driving, feels safe in home environment.  Reviewed allergies, medications, past medical, surgical, family, and social history.   Review of Systems Review of Systems Constitutional: -fever, -chills, -sweats, -unexpected weight change,-fatigue ENT: -runny nose, -ear pain, -sore throat Cardiology:  -chest pain, -palpitations, -edema Respiratory: -cough, -shortness of breath, -wheezing Gastroenterology: -abdominal pain, -nausea, -vomiting, -diarrhea, -constipation  Hematology: -bleeding or bruising problems Musculoskeletal: -arthralgias, -myalgias, -joint swelling, -back pain Ophthalmology:  -vision changes Urology: -dysuria, -difficulty urinating, -hematuria, -urinary frequency, -urgency Neurology: -headache, -weakness, -tingling, -numbness       Objective:   Physical Exam BP 120/70   Pulse 74   Temp 98.2 F (36.8 C) (Oral)   Ht 5\' 10"  (1.778 m)   Wt 221 lb (100.2 kg)   BMI 31.71 kg/m   General Appearance:    Alert, cooperative, no distress, appears stated age  Head:    Normocephalic, without obvious abnormality, atraumatic  Eyes:    PERRL, conjunctiva/corneas clear, EOM's intact, fundi    benign  Ears:    Normal TM's and external ear canals  Nose:   Nares normal, mucosa normal, no drainage or sinus   tenderness  Throat:   Lips, mucosa, and tongue normal; teeth and gums normal  Neck:   Supple, no lymphadenopathy;  thyroid:  no   enlargement/tenderness/nodules; no carotid   bruit or JVD  Back:    Spine nontender, no curvature, ROM normal, no CVA     tenderness  Lungs:     Clear to auscultation bilaterally without wheezes, rales or     ronchi; respirations unlabored  Chest Wall:    No tenderness or deformity   Heart:    Regular rate and rhythm, S1 and S2 normal, no murmur, rub   or gallop  Breast Exam:    No chest wall tenderness, masses or gynecomastia  Abdomen:     Soft, non-tender, nondistended, normoactive bowel sounds,    no masses, no hepatosplenomegaly. Incisions with steri strips intact. No sign of infection.   Genitalia:  Normal male external genitalia without lesions.  Testicles without masses.  No inguinal hernias.     Extremities:   No clubbing, cyanosis or edema  Pulses:   2+ and symmetric all extremities  Skin:   Skin color, texture, turgor normal,   Lymph nodes:   Cervical, supraclavicular, and axillary nodes normal  Neurologic:   CNII-XII intact, normal strength, sensation and gait; reflexes 2+ and symmetric throughout          Psych:   Normal mood, affect, hygiene and grooming.    Urinalysis dipstick: negative       Assessment & Plan:    Routine general medical examination at a health care facility - Plan: POCT Urinalysis DIP (Proadvantage Device), CBC with Differential/Platelet, Comprehensive metabolic panel, TSH, T4, free, Lipid panel  Hyperlipidemia, unspecified hyperlipidemia type - Plan: TSH, T4, free, Lipid panel  Obesity (BMI 30.0-34.9) - Plan: CBC with Differential/Platelet, Comprehensive metabolic panel, TSH, T4, free, Lipid panel  Family history of thyroid disease in mother - Plan: TSH, T4, free  Vaccine counseling  In good spirits. Motivated to make healthy lifestyle changes. Apparently he has been drinking a significant amount of alcohol and eating poorly. No recent exercise.  Counseling done on healthy diet, exercise and taking better care of himself.  Advised to stop alcohol use or to cut back.  Weight gain explainable per patient.  History of hyperlipidemia and will recheck lipids today.  Declines STD testing.  Declines Tdap and influenza. He will return. Counseling done on vaccines.  Visual acuity is ok.  Eczema and plans to schedule with dermatology for this. Has medication at home.  Will schedule with a dentist and a list was given.  Follow up pending labs.

## 2017-09-06 ENCOUNTER — Ambulatory Visit: Payer: 59 | Admitting: Family Medicine

## 2017-09-06 ENCOUNTER — Encounter: Payer: Self-pay | Admitting: Family Medicine

## 2017-09-06 VITALS — BP 120/70 | HR 74 | Temp 98.2°F | Ht 70.0 in | Wt 221.0 lb

## 2017-09-06 DIAGNOSIS — Z0000 Encounter for general adult medical examination without abnormal findings: Secondary | ICD-10-CM | POA: Diagnosis not present

## 2017-09-06 DIAGNOSIS — E669 Obesity, unspecified: Secondary | ICD-10-CM

## 2017-09-06 DIAGNOSIS — Z7189 Other specified counseling: Secondary | ICD-10-CM

## 2017-09-06 DIAGNOSIS — Z7185 Encounter for immunization safety counseling: Secondary | ICD-10-CM

## 2017-09-06 DIAGNOSIS — Z8349 Family history of other endocrine, nutritional and metabolic diseases: Secondary | ICD-10-CM | POA: Diagnosis not present

## 2017-09-06 DIAGNOSIS — E785 Hyperlipidemia, unspecified: Secondary | ICD-10-CM | POA: Diagnosis not present

## 2017-09-06 LAB — POCT URINALYSIS DIP (PROADVANTAGE DEVICE)
Bilirubin, UA: NEGATIVE
Glucose, UA: NEGATIVE mg/dL
Ketones, POC UA: NEGATIVE mg/dL
Leukocytes, UA: NEGATIVE
Nitrite, UA: NEGATIVE
PROTEIN UA: NEGATIVE mg/dL
RBC UA: NEGATIVE
SPECIFIC GRAVITY, URINE: 1.025
UUROB: NEGATIVE
pH, UA: 6 (ref 5.0–8.0)

## 2017-09-06 NOTE — Patient Instructions (Signed)
Dermatology offices  [facility]: Phone #: [phone] Address: [address], [place], [place] [address]  [facility]: Phone: [phone]  Address: [address], [place], [place] [address]  [facility] Address: [address], [place], [place] [address] Phone: [phone]   You can call and schedule your Dentist appointment at any of the following offices:   [facility] Address: [address]  [place], [place] [address] Phone #: [phone]  [facility] Address: [address] [place], [place] [address] Phone # [phone]  [facility]  Address: [address]                                                                 [place], [place] [address] Phone #: [phone]    Preventative Care for Adults, Male       REGULAR HEALTH EXAMS:  A routine yearly physical is a good way to check in with your primary care provider about your health and preventive screening. It is also an opportunity to share updates about your health and any concerns you have, and receive a thorough all-over exam.   Most health insurance companies pay for at least some preventative services.  Check with your health plan for specific coverages.  WHAT PREVENTATIVE SERVICES DO MEN NEED?  Adult men should have their weight and blood pressure checked regularly.   Men age 335 and older should have their cholesterol levels checked regularly.  Beginning at age 41 and continuing to age 41, men should be screened for colorectal cancer.  Certain people should may need continued testing until age 41.  Other cancer screening may include exams for testicular and prostate cancer.  Updating vaccinations is part of preventative care.  Vaccinations help protect against diseases such as the flu.  Lab tests are generally done as part of preventative care to screen for anemia and blood  disorders, to screen for problems with the kidneys and liver, to screen for bladder problems, to check blood sugar, and to check your cholesterol level.  Preventative services generally include counseling about diet, exercise, avoiding tobacco, drugs, excessive alcohol consumption, and sexually transmitted infections.    GENERAL RECOMMENDATIONS FOR GOOD HEALTH:  Healthy diet:  Eat a variety of foods, including fruit, vegetables, animal or vegetable protein, such as meat, fish, chicken, and eggs, or beans, lentils, tofu, and grains, such as rice.  Drink plenty of water daily.  Decrease saturated fat in the diet, avoid lots of red meat, processed foods, sweets, fast foods, and fried foods.  Exercise:  Aerobic exercise helps maintain good heart health. At least 30-40 minutes of moderate-intensity exercise is recommended. For example, a brisk walk that increases your heart rate and breathing. This should be done on most days of the week.   Find a type of exercise or a variety of exercises that you enjoy so that it becomes a part of your daily life.  Examples are running, walking, swimming, water aerobics, and biking.  For motivation and support, explore group exercise such as aerobic class, spin class, Zumba, Yoga,or  martial arts, etc.    Set exercise goals for yourself, such as a certain weight goal, walk or run in a race such as  a 5k walk/run.  Speak to your primary care provider about exercise goals.  Disease prevention:  If you smoke or chew tobacco, find out from your caregiver how to quit. It can literally save your life, no matter how long you have been a tobacco user. If you do not use tobacco, never begin.   Maintain a healthy diet and normal weight. Increased weight leads to problems with blood pressure and diabetes.   The Body Mass Index or BMI is a way of measuring how much of your body is fat. Having a BMI above 27 increases the risk of heart disease, diabetes, hypertension,  stroke and other problems related to obesity. Your caregiver can help determine your BMI and based on it develop an exercise and dietary program to help you achieve or maintain this important measurement at a healthful level.  High blood pressure causes heart and blood vessel problems.  Persistent high blood pressure should be treated with medicine if weight loss and exercise do not work.   Fat and cholesterol leaves deposits in your arteries that can block them. This causes heart disease and vessel disease elsewhere in your body.  If your cholesterol is found to be high, or if you have heart disease or certain other medical conditions, then you may need to have your cholesterol monitored frequently and be treated with medication.   Ask if you should have a stress test if your history suggests this. A stress test is a test done on a treadmill that looks for heart disease. This test can find disease prior to there being a problem.  Avoid drinking alcohol in excess (more than two drinks per day).  Avoid use of street drugs. Do not share needles with anyone. Ask for professional help if you need assistance or instructions on stopping the use of alcohol, cigarettes, and/or drugs.  Brush your teeth twice a day with fluoride toothpaste, and floss once a day. Good oral hygiene prevents tooth decay and gum disease. The problems can be painful, unattractive, and can cause other health problems. Visit your dentist for a routine oral and dental check up and preventive care every 6-12 months.   Look at your skin regularly.  Use a mirror to look at your back. Notify your caregivers of changes in moles, especially if there are changes in shapes, colors, a size larger than a pencil eraser, an irregular border, or development of new moles.  Safety:  Use seatbelts 100% of the time, whether driving or as a passenger.  Use safety devices such as hearing protection if you work in environments with loud noise or  significant background noise.  Use safety glasses when doing any work that could send debris in to the eyes.  Use a helmet if you ride a bike or motorcycle.  Use appropriate safety gear for contact sports.  Talk to your caregiver about gun safety.  Use sunscreen with a SPF (or skin protection factor) of 15 or greater.  Lighter skinned people are at a greater risk of skin cancer. Don't forget to also wear sunglasses in order to protect your eyes from too much damaging sunlight. Damaging sunlight can accelerate cataract formation.   Practice safe sex. Use condoms. Condoms are used for birth control and to help reduce the spread of sexually transmitted infections (or STIs).  Some of the STIs are gonorrhea (the clap), chlamydia, syphilis, trichomonas, herpes, HPV (human papilloma virus) and HIV (human immunodeficiency virus) which causes AIDS. The herpes, HIV and HPV are  viral illnesses that have no cure. These can result in disability, cancer and death.   Keep carbon monoxide and smoke detectors in your home functioning at all times. Change the batteries every 6 months or use a model that plugs into the wall.   Vaccinations:  Stay up to date with your tetanus shots and other required immunizations. You should have a booster for tetanus every 10 years. Be sure to get your flu shot every year, since 5%-20% of the [place] population comes down with the flu. The flu vaccine changes each year, so being vaccinated once is not enough. Get your shot in the fall, before the flu season peaks.   Other vaccines to consider:  Pneumococcal vaccine to protect against certain types of pneumonia.  This is normally recommended for adults age 38 or older.  However, adults younger than 41 years old with certain underlying conditions such as diabetes, heart or lung disease should also receive the vaccine.  Shingles vaccine to protect against Varicella Zoster if you are older than age 65, or younger than 41 years old with  certain underlying illness.  Hepatitis A vaccine to protect against a form of infection of the liver by a virus acquired from food.  Hepatitis B vaccine to protect against a form of infection of the liver by a virus acquired from blood or body fluids, particularly if you work in health care.  If you plan to travel internationally, check with your local health department for specific vaccination recommendations.  Cancer Screening:  Most routine colon cancer screening begins at the age of 27. On a yearly basis, doctors may provide special easy to use take-home tests to check for hidden blood in the stool. Sigmoidoscopy or colonoscopy can detect the earliest forms of colon cancer and is life saving. These tests use a small camera at the end of a tube to directly examine the colon. Speak to your caregiver about this at age 30, when routine screening begins (and is repeated every 5 years unless early forms of pre-cancerous polyps or small growths are found).   At the age of 31 men usually start screening for prostate cancer every year. Screening may begin at a younger age for those with higher risk. Those at higher risk include African-Americans or having a family history of prostate cancer. There are two types of tests for prostate cancer:   Prostate-specific antigen (PSA) testing. Recent studies raise questions about prostate cancer using PSA and you should discuss this with your caregiver.   Digital rectal exam (in which your doctor's lubricated and gloved finger feels for enlargement of the prostate through the anus).   Screening for testicular cancer.  Do a monthly exam of your testicles. Gently roll each testicle between your thumb and fingers, feeling for any abnormal lumps. The best time to do this is after a hot shower or bath when the tissues are looser. Notify your caregivers of any lumps, tenderness or changes in size or shape immediately.

## 2017-09-07 ENCOUNTER — Other Ambulatory Visit: Payer: Self-pay | Admitting: Family Medicine

## 2017-09-07 DIAGNOSIS — E782 Mixed hyperlipidemia: Secondary | ICD-10-CM

## 2017-09-07 LAB — CBC WITH DIFFERENTIAL/PLATELET
BASOS ABS: 0 10*3/uL (ref 0.0–0.2)
Basos: 0 %
EOS (ABSOLUTE): 0.3 10*3/uL (ref 0.0–0.4)
Eos: 3 %
Hematocrit: 48.1 % (ref 37.5–51.0)
Hemoglobin: 17.1 g/dL (ref 13.0–17.7)
Immature Grans (Abs): 0 10*3/uL (ref 0.0–0.1)
Immature Granulocytes: 0 %
LYMPHS ABS: 1.5 10*3/uL (ref 0.7–3.1)
Lymphs: 19 %
MCH: 33.5 pg — ABNORMAL HIGH (ref 26.6–33.0)
MCHC: 35.6 g/dL (ref 31.5–35.7)
MCV: 94 fL (ref 79–97)
MONOS ABS: 0.5 10*3/uL (ref 0.1–0.9)
Monocytes: 6 %
Neutrophils Absolute: 5.5 10*3/uL (ref 1.4–7.0)
Neutrophils: 72 %
Platelets: 371 10*3/uL (ref 150–450)
RBC: 5.1 x10E6/uL (ref 4.14–5.80)
RDW: 13.2 % (ref 12.3–15.4)
WBC: 7.8 10*3/uL (ref 3.4–10.8)

## 2017-09-07 LAB — COMPREHENSIVE METABOLIC PANEL
ALK PHOS: 77 IU/L (ref 39–117)
ALT: 54 IU/L — ABNORMAL HIGH (ref 0–44)
AST: 40 IU/L (ref 0–40)
Albumin/Globulin Ratio: 1.6 (ref 1.2–2.2)
Albumin: 4.6 g/dL (ref 3.5–5.5)
BUN/Creatinine Ratio: 16 (ref 9–20)
BUN: 20 mg/dL (ref 6–24)
Bilirubin Total: 0.6 mg/dL (ref 0.0–1.2)
CHLORIDE: 102 mmol/L (ref 96–106)
CO2: 22 mmol/L (ref 20–29)
CREATININE: 1.25 mg/dL (ref 0.76–1.27)
Calcium: 10.3 mg/dL — ABNORMAL HIGH (ref 8.7–10.2)
GFR calc Af Amer: 82 mL/min/{1.73_m2} (ref >59)
GFR calc non Af Amer: 71 mL/min/{1.73_m2} (ref >59)
GLUCOSE: 95 mg/dL (ref 65–99)
Globulin, Total: 2.8 g/dL (ref 1.5–4.5)
Potassium: 5.2 mmol/L (ref 3.5–5.2)
Sodium: 144 mmol/L (ref 134–144)
Total Protein: 7.4 g/dL (ref 6.0–8.5)

## 2017-09-07 LAB — LIPID PANEL
CHOL/HDL RATIO: 7.8 ratio — AB (ref 0.0–5.0)
Cholesterol, Total: 265 mg/dL — ABNORMAL HIGH (ref 100–199)
HDL: 34 mg/dL — AB (ref >39)
LDL Calculated: 195 mg/dL — ABNORMAL HIGH (ref 0–99)
Triglycerides: 180 mg/dL — ABNORMAL HIGH (ref 0–149)
VLDL Cholesterol Cal: 36 mg/dL (ref 5–40)

## 2017-09-07 LAB — T4, FREE: FREE T4: 1.42 ng/dL (ref 0.82–1.77)

## 2017-09-07 LAB — TSH: TSH: 0.934 u[IU]/mL (ref 0.450–4.500)

## 2017-09-07 MED ORDER — ATORVASTATIN CALCIUM 40 MG PO TABS
40.0000 mg | ORAL_TABLET | Freq: Every day | ORAL | 2 refills | Status: DC
Start: 2017-09-07 — End: 2017-12-06

## 2017-09-08 LAB — HEPATITIS PANEL, ACUTE
HEP B C IGM: NEGATIVE
Hep A IgM: NEGATIVE
Hep C Virus Ab: <0.1 s/co ratio (ref 0.0–0.9)
Hepatitis B Surface Ag: NEGATIVE

## 2017-09-08 LAB — SPECIMEN STATUS REPORT

## 2017-12-06 ENCOUNTER — Other Ambulatory Visit: Payer: Self-pay | Admitting: Family Medicine

## 2017-12-06 DIAGNOSIS — E782 Mixed hyperlipidemia: Secondary | ICD-10-CM

## 2018-03-17 ENCOUNTER — Other Ambulatory Visit: Payer: Self-pay | Admitting: Family Medicine

## 2018-03-17 DIAGNOSIS — E782 Mixed hyperlipidemia: Secondary | ICD-10-CM

## 2018-03-17 NOTE — Telephone Encounter (Signed)
Is this ok to refill?  

## 2018-03-17 NOTE — Telephone Encounter (Signed)
He needs a follow up appointment. Did not follow up as recommended. No refills until he schedules and then only 30 days. Thanks.

## 2018-04-03 ENCOUNTER — Encounter: Payer: Self-pay | Admitting: Family Medicine

## 2018-04-03 ENCOUNTER — Ambulatory Visit: Payer: BLUE CROSS/BLUE SHIELD | Admitting: Family Medicine

## 2018-04-03 ENCOUNTER — Other Ambulatory Visit: Payer: Self-pay

## 2018-04-03 VITALS — BP 110/70 | HR 66 | Wt 232.0 lb

## 2018-04-03 DIAGNOSIS — R945 Abnormal results of liver function studies: Secondary | ICD-10-CM | POA: Diagnosis not present

## 2018-04-03 DIAGNOSIS — E782 Mixed hyperlipidemia: Secondary | ICD-10-CM | POA: Diagnosis not present

## 2018-04-03 DIAGNOSIS — Z79899 Other long term (current) drug therapy: Secondary | ICD-10-CM | POA: Diagnosis not present

## 2018-04-03 DIAGNOSIS — R748 Abnormal levels of other serum enzymes: Secondary | ICD-10-CM | POA: Diagnosis not present

## 2018-04-03 DIAGNOSIS — R7989 Other specified abnormal findings of blood chemistry: Secondary | ICD-10-CM

## 2018-04-03 NOTE — Progress Notes (Signed)
   Subjective:    Patient ID: David Kent, male    DOB: 10-11-1975, 43 y.o.   MRN: [id number]  HPI Chief Complaint  Patient presents with  . med check    med check. not like cholesterol med, affecting sex drive.    Here today for medication management visit.  States he has been taking statin daily but ran out 5 days ago.  Noticed improvement with libido since being off the medication. He would like to switch to a different statin to see if this helps his libido. No arthralgias or myalgias.   Elevated LFTs- reports acohol use is decreased. 2 times per week and only 1 glass.  Eating Keto diet.   Denies fever, chills, dizziness, chest pain, palpitations, shortness of breath, abdominal pain, N/V/D, urinary symptoms, LE edema.   Reviewed allergies, medications, past medical, surgical, family, and social history.    Review of Systems Pertinent positives and negatives in the history of present illness.     Objective:   Physical Exam BP 110/70   Pulse 66   Wt 232 lb (105.2 kg)   BMI 33.29 kg/m   Alert and in no distress.  Pharyngeal area is normal. Neck is supple without adenopathy or thyromegaly. Cardiac exam shows a regular sinus rhythm without murmurs or gallops. Lungs are clear to auscultation. Extremities without edema.       Assessment & Plan:  Mixed hyperlipidemia - Plan: Lipid panel  Elevated LFTs - Plan: CBC with Differential/Platelet, Comprehensive metabolic panel  Medication management - Plan: CBC with Differential/Platelet, Lipid panel  Started him on a statin and he did not follow up for labs. Here today for medication management visit.  He would like to switch statins. Believes Lipitor affected his libido since this has improved over the past 5 days since being out of the medication.  Check labs and follow up.

## 2018-04-04 ENCOUNTER — Other Ambulatory Visit: Payer: Self-pay | Admitting: Family Medicine

## 2018-04-04 ENCOUNTER — Encounter: Payer: Self-pay | Admitting: Family Medicine

## 2018-04-04 ENCOUNTER — Other Ambulatory Visit: Payer: Self-pay | Admitting: Internal Medicine

## 2018-04-04 DIAGNOSIS — R945 Abnormal results of liver function studies: Secondary | ICD-10-CM

## 2018-04-04 DIAGNOSIS — R7989 Other specified abnormal findings of blood chemistry: Secondary | ICD-10-CM | POA: Insufficient documentation

## 2018-04-04 DIAGNOSIS — R748 Abnormal levels of other serum enzymes: Secondary | ICD-10-CM

## 2018-04-04 DIAGNOSIS — E782 Mixed hyperlipidemia: Secondary | ICD-10-CM

## 2018-04-04 LAB — COMPREHENSIVE METABOLIC PANEL
A/G RATIO: 2 (ref 1.2–2.2)
ALK PHOS: 73 IU/L (ref 39–117)
ALT: 73 IU/L — AB (ref 0–44)
AST: 41 IU/L — AB (ref 0–40)
Albumin: 4.8 g/dL (ref 4.0–5.0)
BILIRUBIN TOTAL: 0.6 mg/dL (ref 0.0–1.2)
BUN/Creatinine Ratio: 11 (ref 9–20)
BUN: 13 mg/dL (ref 6–24)
CALCIUM: 9.9 mg/dL (ref 8.7–10.2)
CHLORIDE: 100 mmol/L (ref 96–106)
CO2: 24 mmol/L (ref 20–29)
Creatinine, Ser: 1.19 mg/dL (ref 0.76–1.27)
GFR calc Af Amer: 87 mL/min/{1.73_m2} (ref >59)
GFR, EST NON AFRICAN AMERICAN: 75 mL/min/{1.73_m2} (ref >59)
GLOBULIN, TOTAL: 2.4 g/dL (ref 1.5–4.5)
Glucose: 93 mg/dL (ref 65–99)
POTASSIUM: 4.5 mmol/L (ref 3.5–5.2)
SODIUM: 141 mmol/L (ref 134–144)
Total Protein: 7.2 g/dL (ref 6.0–8.5)

## 2018-04-04 LAB — LIPID PANEL
CHOLESTEROL TOTAL: 243 mg/dL — AB (ref 100–199)
Chol/HDL Ratio: 6.6 ratio — ABNORMAL HIGH (ref 0.0–5.0)
HDL: 37 mg/dL — AB (ref >39)
LDL Calculated: 160 mg/dL — ABNORMAL HIGH (ref 0–99)
TRIGLYCERIDES: 231 mg/dL — AB (ref 0–149)
VLDL Cholesterol Cal: 46 mg/dL — ABNORMAL HIGH (ref 5–40)

## 2018-04-04 LAB — CBC WITH DIFFERENTIAL/PLATELET
BASOS: 1 %
Basophils Absolute: 0.1 10*3/uL (ref 0.0–0.2)
EOS (ABSOLUTE): 0.2 10*3/uL (ref 0.0–0.4)
Eos: 3 %
Hematocrit: 48.1 % (ref 37.5–51.0)
Hemoglobin: 17 g/dL (ref 13.0–17.7)
IMMATURE GRANULOCYTES: 0 %
Immature Grans (Abs): 0 10*3/uL (ref 0.0–0.1)
LYMPHS ABS: 1.7 10*3/uL (ref 0.7–3.1)
Lymphs: 32 %
MCH: 32.1 pg (ref 26.6–33.0)
MCHC: 35.3 g/dL (ref 31.5–35.7)
MCV: 91 fL (ref 79–97)
MONOS ABS: 0.4 10*3/uL (ref 0.1–0.9)
Monocytes: 8 %
NEUTROS PCT: 56 %
Neutrophils Absolute: 2.9 10*3/uL (ref 1.4–7.0)
PLATELETS: 323 10*3/uL (ref 150–450)
RBC: 5.29 x10E6/uL (ref 4.14–5.80)
RDW: 12.7 % (ref 11.6–15.4)
WBC: 5.3 10*3/uL (ref 3.4–10.8)

## 2018-04-04 MED ORDER — ROSUVASTATIN CALCIUM 20 MG PO TABS: 20.0000 mg | ORAL_TABLET | Freq: Every day | ORAL | 2 refills | Status: DC

## 2018-04-11 ENCOUNTER — Other Ambulatory Visit: Payer: Commercial Managed Care - HMO

## 2018-04-18 LAB — HEPATITIS PANEL, ACUTE
HEP A IGM: NEGATIVE
Hep B C IgM: NEGATIVE
Hepatitis B Surface Ag: NEGATIVE

## 2018-04-18 LAB — SPECIMEN STATUS REPORT

## 2018-05-02 ENCOUNTER — Other Ambulatory Visit: Payer: Self-pay

## 2018-05-29 ENCOUNTER — Ambulatory Visit: Payer: BLUE CROSS/BLUE SHIELD | Admitting: Family Medicine

## 2018-06-05 ENCOUNTER — Ambulatory Visit
Admission: RE | Admit: 2018-06-05 | Discharge: 2018-06-05 | Disposition: A | Discharge status: Home / Self Care | Payer: BLUE CROSS/BLUE SHIELD | Source: Ambulatory Visit | Attending: Family Medicine | Admitting: Family Medicine

## 2018-06-05 ENCOUNTER — Encounter: Payer: Self-pay | Admitting: Family Medicine

## 2018-06-05 ENCOUNTER — Other Ambulatory Visit: Payer: Self-pay

## 2018-06-05 DIAGNOSIS — R748 Abnormal levels of other serum enzymes: Secondary | ICD-10-CM

## 2018-06-05 DIAGNOSIS — K760 Fatty (change of) liver, not elsewhere classified: Secondary | ICD-10-CM

## 2018-06-05 HISTORY — DX: Fatty (change of) liver, not elsewhere classified: K76.0

## 2018-06-06 ENCOUNTER — Encounter: Payer: Self-pay | Admitting: Family Medicine

## 2018-06-06 ENCOUNTER — Other Ambulatory Visit: Payer: Self-pay

## 2018-06-06 ENCOUNTER — Ambulatory Visit: Payer: BLUE CROSS/BLUE SHIELD | Admitting: Family Medicine

## 2018-06-06 VITALS — Wt 225.0 lb

## 2018-06-06 DIAGNOSIS — R945 Abnormal results of liver function studies: Secondary | ICD-10-CM | POA: Diagnosis not present

## 2018-06-06 DIAGNOSIS — Z8709 Personal history of other diseases of the respiratory system: Secondary | ICD-10-CM | POA: Diagnosis not present

## 2018-06-06 DIAGNOSIS — R05 Cough: Secondary | ICD-10-CM

## 2018-06-06 DIAGNOSIS — E782 Mixed hyperlipidemia: Secondary | ICD-10-CM

## 2018-06-06 DIAGNOSIS — R7989 Other specified abnormal findings of blood chemistry: Secondary | ICD-10-CM

## 2018-06-06 DIAGNOSIS — R062 Wheezing: Secondary | ICD-10-CM | POA: Diagnosis not present

## 2018-06-06 DIAGNOSIS — R059 Cough, unspecified: Secondary | ICD-10-CM

## 2018-06-06 DIAGNOSIS — Z87891 Personal history of nicotine dependence: Secondary | ICD-10-CM | POA: Diagnosis not present

## 2018-06-06 DIAGNOSIS — J45909 Unspecified asthma, uncomplicated: Secondary | ICD-10-CM | POA: Insufficient documentation

## 2018-06-06 DIAGNOSIS — K760 Fatty (change of) liver, not elsewhere classified: Secondary | ICD-10-CM

## 2018-06-06 MED ORDER — ALBUTEROL SULFATE HFA 108 (90 BASE) MCG/ACT IN AERS: 2.0000 | INHALATION_SPRAY | Freq: Four times a day (QID) | RESPIRATORY_TRACT | 0 refills | Status: DC | PRN

## 2018-06-06 NOTE — Progress Notes (Signed)
Subjective:   Documentation for virtual audio and video telecommunications through [employer] encounter:  The patient was located at home. 2 patient identifiers.  The provider was located in the office. The patient did consent to this visit and is aware of possible charges through their insurance for this visit.  The other persons participating in this telemedicine service were none.    Patient ID: David Kent, male    DOB: 19-Dec-1975, 43 y.o.   MRN: [id number]  HPI Chief Complaint  Patient presents with   follow-up    follow-up on cholesterol. had U/S yesterday and will need results today   Due for a follow-up visit regarding hyperlipidemia and elevated liver enzymes. He also has a new complaint that he would like to discuss.  Reports good daily compliance with Crestor and no side effects.  He is due for follow-up lipid panel.  We are monitoring mildly elevated liver enzymes.  He had a negative acute otitis panel.  Ultrasound reveals fatty liver. He has significantly cut back alcohol intake and denies taking NSAIDs.  Complains of a 18-month history of tightness in his chest with wheezing only at night and only on days that he exercises..  Feels fine during the day.  It does not happen while he is exercising or immediately after.  He also states it does not occur the nights after he does not exercise.  He is exercising outdoors.  States he has a history of allergies and asthma as a child.  He had to use an inhaler as a child.  Has not had any issues in several years but he is new to the area.  Denies fever, chills, headache, URI symptoms, chest pain, palpitations, abdominal pain, nausea, vomiting, diarrhea, lower extremity edema.  Former smoker. Does not vape.   Reviewed allergies, medications, past medical, surgical, family, and social history.    Review of Systems Pertinent positives and negatives in the history of present illness.     Objective:   Physical Exam Wt  225 lb (102.1 kg)    BMI 32.28 kg/m   Alert and oriented and in no acute distress. Respirations unlabored. Speaking in complete sentences without difficulty. Normal speech, mood and thought process. Unable to examine otherwise due to virtual visit.       Assessment & Plan:  Elevated LFTs - Plan: Comprehensive metabolic panel  Former smoker - Plan: DG Chest 2 View  History of asthma  Wheezing - Plan: CBC with Differential/Platelet, DG Chest 2 View  Mixed hyperlipidemia - Plan: Lipid panel  Fatty liver  Cough - Plan: CBC with Differential/Platelet, DG Chest 2 View  Discussed limitations of virtual visit. He has had persistent mildly elevated liver enzymes.  Negative acute hepatitis panel.  Ultrasound shows fatty liver.  Reviewed all results with him.  Advised him to take good care of his liver by avoiding alcohol and limiting NSAID use.  Discussed weight loss may help improve this. He will come in tomorrow for repeat CMP.  Advised that if we start to notice a significant spike in his liver enzymes or at any point he would like to be referred to GI then I am happy to do this. He is fine with this plan. Discussed the potential for liver disease such as cirrhosis or liver cancer in the future due to having fatty liver.   Symptoms of nighttime chest tightness and wheezing after daytime exercise outdoors sounds suspicious for allergies and asthma. He has a history of this as a child  and had to use an inhaler then.  Former smoker but quit several years ago.  Chest XR ordered Albuterol inhaler prescribed. He will start on non drowsy antihistamine today.   Lab visit in the morning to recheck lipids as well.  Follow up pending labs and if he is not noticing improvement with antihistamine or if he is needing albuterol often.   Time spent on call was 26 minutes and in review of previous records 3 minutes total.  This virtual service is not related to other E/M service within previous 7 days.

## 2018-06-07 ENCOUNTER — Ambulatory Visit
Admission: RE | Admit: 2018-06-07 | Discharge: 2018-06-07 | Disposition: A | Discharge status: Home / Self Care | Payer: BLUE CROSS/BLUE SHIELD | Source: Ambulatory Visit | Attending: Family Medicine | Admitting: Family Medicine

## 2018-06-07 ENCOUNTER — Other Ambulatory Visit: Payer: BLUE CROSS/BLUE SHIELD

## 2018-06-07 DIAGNOSIS — R062 Wheezing: Secondary | ICD-10-CM | POA: Diagnosis not present

## 2018-06-07 DIAGNOSIS — R7989 Other specified abnormal findings of blood chemistry: Secondary | ICD-10-CM

## 2018-06-07 DIAGNOSIS — R945 Abnormal results of liver function studies: Secondary | ICD-10-CM | POA: Diagnosis not present

## 2018-06-07 DIAGNOSIS — R05 Cough: Secondary | ICD-10-CM | POA: Diagnosis not present

## 2018-06-07 DIAGNOSIS — R059 Cough, unspecified: Secondary | ICD-10-CM

## 2018-06-07 DIAGNOSIS — Z87891 Personal history of nicotine dependence: Secondary | ICD-10-CM

## 2018-06-07 DIAGNOSIS — E782 Mixed hyperlipidemia: Secondary | ICD-10-CM | POA: Diagnosis not present

## 2018-06-08 LAB — CBC WITH DIFFERENTIAL/PLATELET
Basophils Absolute: 0.1 10*3/uL (ref 0.0–0.2)
Basos: 1 %
EOS (ABSOLUTE): 0.3 10*3/uL (ref 0.0–0.4)
Eos: 5 %
Hematocrit: 49.2 % (ref 37.5–51.0)
Hemoglobin: 17.3 g/dL (ref 13.0–17.7)
Immature Grans (Abs): 0 10*3/uL (ref 0.0–0.1)
Immature Granulocytes: 0 %
Lymphocytes Absolute: 1.8 10*3/uL (ref 0.7–3.1)
Lymphs: 28 %
MCH: 32.3 pg (ref 26.6–33.0)
MCHC: 35.2 g/dL (ref 31.5–35.7)
MCV: 92 fL (ref 79–97)
Monocytes Absolute: 0.5 10*3/uL (ref 0.1–0.9)
Monocytes: 7 %
Neutrophils Absolute: 3.8 10*3/uL (ref 1.4–7.0)
Neutrophils: 59 %
Platelets: 311 10*3/uL (ref 150–450)
RBC: 5.35 x10E6/uL (ref 4.14–5.80)
RDW: 13.2 % (ref 11.6–15.4)
WBC: 6.4 10*3/uL (ref 3.4–10.8)

## 2018-06-08 LAB — COMPREHENSIVE METABOLIC PANEL
ALT: 66 IU/L — ABNORMAL HIGH (ref 0–44)
AST: 39 IU/L (ref 0–40)
Albumin/Globulin Ratio: 2.1 (ref 1.2–2.2)
Albumin: 4.5 g/dL (ref 4.0–5.0)
Alkaline Phosphatase: 74 IU/L (ref 39–117)
BUN/Creatinine Ratio: 12 (ref 9–20)
BUN: 13 mg/dL (ref 6–24)
Bilirubin Total: 0.8 mg/dL (ref 0.0–1.2)
CO2: 24 mmol/L (ref 20–29)
Calcium: 9.8 mg/dL (ref 8.7–10.2)
Chloride: 103 mmol/L (ref 96–106)
Creatinine, Ser: 1.05 mg/dL (ref 0.76–1.27)
GFR calc Af Amer: 101 mL/min/{1.73_m2} (ref >59)
GFR calc non Af Amer: 87 mL/min/{1.73_m2} (ref >59)
Globulin, Total: 2.1 g/dL (ref 1.5–4.5)
Glucose: 95 mg/dL (ref 65–99)
Potassium: 5 mmol/L (ref 3.5–5.2)
Sodium: 140 mmol/L (ref 134–144)
Total Protein: 6.6 g/dL (ref 6.0–8.5)

## 2018-06-08 LAB — LIPID PANEL
Chol/HDL Ratio: 4.9 ratio (ref 0.0–5.0)
Cholesterol, Total: 175 mg/dL (ref 100–199)
HDL: 36 mg/dL — ABNORMAL LOW (ref >39)
LDL Calculated: 99 mg/dL (ref 0–99)
Triglycerides: 201 mg/dL — ABNORMAL HIGH (ref 0–149)
VLDL Cholesterol Cal: 40 mg/dL (ref 5–40)

## 2018-06-22 ENCOUNTER — Telehealth: Payer: Self-pay | Admitting: Family Medicine

## 2018-06-22 NOTE — Telephone Encounter (Signed)
Pt called and states that the albuterol is working very well. He is asking if he should stay on and if so he will need refills. He also asked about his xray. He states he say in mychart the results but wants to know if any additional info he needs to now concerning that. Pt uses [facility] [address]. Pt can be reached at [phone].

## 2018-06-22 NOTE — Telephone Encounter (Signed)
Please ask how often he is using the albuterol. If he is needing this more than 2 times per week then he may need to be on a maintenance inhaler to reduce the flare ups that cause him to need it.  If he is using albuterol more than twice weekly, let's have him come in or do a virtual visit in the next week to discuss this. Ok to refill his albuterol.

## 2018-06-22 NOTE — Telephone Encounter (Signed)
Pt was advised and a appointment . KH

## 2018-06-26 ENCOUNTER — Other Ambulatory Visit: Payer: Self-pay

## 2018-06-26 ENCOUNTER — Encounter: Payer: Self-pay | Admitting: Family Medicine

## 2018-06-26 ENCOUNTER — Ambulatory Visit: Payer: BC Managed Care – PPO | Admitting: Family Medicine

## 2018-06-26 VITALS — BP 120/70 | HR 68 | Temp 99.0°F | Resp 16 | Wt 234.8 lb

## 2018-06-26 DIAGNOSIS — J45909 Unspecified asthma, uncomplicated: Secondary | ICD-10-CM

## 2018-06-26 DIAGNOSIS — J4540 Moderate persistent asthma, uncomplicated: Secondary | ICD-10-CM | POA: Diagnosis not present

## 2018-06-26 DIAGNOSIS — Z87891 Personal history of nicotine dependence: Secondary | ICD-10-CM | POA: Diagnosis not present

## 2018-06-26 DIAGNOSIS — R0789 Other chest pain: Secondary | ICD-10-CM

## 2018-06-26 MED ORDER — ALBUTEROL SULFATE HFA 108 (90 BASE) MCG/ACT IN AERS: 2.0000 | INHALATION_SPRAY | Freq: Four times a day (QID) | RESPIRATORY_TRACT | 0 refills | Status: DC | PRN

## 2018-06-26 MED ORDER — FLUTICASONE FUROATE-VILANTEROL 200-25 MCG/INH IN AEPB: 1.0000 | INHALATION_SPRAY | Freq: Every day | RESPIRATORY_TRACT | 2 refills | Status: DC

## 2018-06-26 NOTE — Patient Instructions (Signed)
Use the Breo once daily. I have you a 2 week sample and also sent the prescription to your pharmacy.   Use albuterol inhaler as needed.   Continue using Xyzal for allergies.    Call for a virtual visit in 2 weeks.

## 2018-06-26 NOTE — Progress Notes (Signed)
   Subjective:    Patient ID: David Kent, male    DOB: [birth year], 43 y.o.   MRN: [id number]  HPI Chief Complaint  Patient presents with  . inhaler    inhaler med. using the one he has regularly, not sure if its as needed inhaler   He is here to follow up on a 3 month history of chest tightness and wheezing which was occurring only at night on days which he exercised. Questioned allergies as a contributing factor.  History of childhood asthma and had not had any issues since childhood. Former smoker.   States he started on Xyxal last month.  Reports using albuterol inhaler 2-3 times per day. Reports increase in chest tightness, wheezing and feeling like he cannot get a full breath.  States symptoms are relieved completely with albuterol.   States he is able to do his intense workouts without needing his inhaler or having chest pain, palpitations, shortness of breath or coughing.   Negative chest XR in May. Does not appear to have sleep apnea based on screening questions.   Denies fever, chills, dizziness, chest pain, palpitations, shortness of breath, orthopnea, PND, abdominal pain, N/V/D, urinary symptoms, LE edema.   Reviewed allergies, medications, past medical, surgical, family, and social history.   Review of Systems Pertinent positives and negatives in the history of present illness.     Objective:   Physical Exam BP 120/70   Pulse 68   Temp 99 F (37.2 C) (Tympanic)   Resp 16   Wt 234 lb 12.8 oz (106.5 kg)   SpO2 99%   PF 640 L/min Comment: 340 before and 640 after  BMI 33.69 kg/m   Alert and oriented and in no distress. Cardiac exam shows a regular sinus rhythm without murmurs or gallops. Lungs are clear to auscultation, no wheezing.  Extremities without edema. Skin is warm and dry.       Assessment & Plan:  Moderate persistent asthma without complication - Plan: Spirometry with graph, fluticasone furoate-vilanterol (BREO ELLIPTA) 200-25 MCG/INH AEPB,  albuterol (VENTOLIN HFA) 108 (90 Base) MCG/ACT inhaler  Childhood asthma without complication, unspecified asthma severity, unspecified whether persistent  Former smoker  Chest tightness  Discussed that he does not appear to have any infectious process.  No sign of cardiac etiology. Does not appear to have sleep apnea. PFT shows mild airway obstruction He appears to have asthma.  Using albuterol too often.  He does report symptoms totally resolved with the albuterol. We will put him on Breo and see how he does over the next couple of weeks.  A sample was provided.  If he is still needing the albuterol inhaler several times per week then I will refer to pulmonology. Reviewed negative chest x-ray and labs with the patient. He will continue on Xyzal to help control any underlying allergy component.  Does not appear to have GERD. Follow-up virtually in 2 weeks

## 2018-06-28 ENCOUNTER — Other Ambulatory Visit: Payer: Self-pay | Admitting: Family Medicine

## 2018-06-28 DIAGNOSIS — E782 Mixed hyperlipidemia: Secondary | ICD-10-CM

## 2018-09-02 ENCOUNTER — Other Ambulatory Visit: Payer: Self-pay | Admitting: Family Medicine

## 2018-09-02 DIAGNOSIS — J4540 Moderate persistent asthma, uncomplicated: Secondary | ICD-10-CM

## 2018-09-04 NOTE — Telephone Encounter (Signed)
Ok

## 2018-09-04 NOTE — Telephone Encounter (Signed)
Is this okay to refill? 

## 2018-09-08 ENCOUNTER — Encounter: Payer: 59 | Admitting: Family Medicine

## 2018-09-12 NOTE — Progress Notes (Signed)
Subjective:    Patient ID: David Kent, male    DOB: [birth year], 43 y.o.   MRN: [id number]  HPI Chief Complaint  Patient presents with  . Annual Exam    fasting annual exam, no concerns.    He is here for a complete physical exam and to follow up on chronic health conditions.   Asthma- much improved. Using albuterol only 1-2 times per week. Using [place] daily.  Sleeping well and not needing it at bedtime.  Taking Xyzal daily as well. Allergies are not bothersome now.   Hx of elevated liver enzymes and fatty liver.  Is not drinking alcohol often. No NSAIDs  HL- taking statin and fish oil daily. Fasting today.   Social history: Lives with wife, 2 kids, works as Press photographer.  Former smoker and stopped 15 years ago.  No drug use.  Diet: healthy  Exercise: regular activity   Immunizations: Tdap and flu needed   Health maintenance:  Colonoscopy: N/A Last PSA: N/A Last Dental Exam: 5 months ago  Last Eye Exam: years ago. Due. Has prescription glasses   Wears seatbelt always, uses sunscreen, smoke detectors in home and functioning, does not text while driving, feels safe in home environment.  Reviewed allergies, medications, past medical, surgical, family, and social history.   Review of Systems Review of Systems Constitutional: -fever, -chills, -sweats, -unexpected weight change,-fatigue ENT: -runny nose, -ear pain, -sore throat Cardiology:  -chest pain, -palpitations, -edema Respiratory: -cough, -shortness of breath, -wheezing Gastroenterology: -abdominal pain, -nausea, -vomiting, -diarrhea, -constipation  Hematology: -bleeding or bruising problems Musculoskeletal: -arthralgias, -myalgias, -joint swelling, -back pain Ophthalmology: -vision changes Urology: -dysuria, -difficulty urinating, -hematuria, -urinary frequency, -urgency Neurology: -headache, -weakness, -tingling, -numbness       Objective:   Physical Exam BP 110/78   Pulse 72   Temp  98.4 F (36.9 C) (Other (Comment))   Ht 5\' 10"  (1.778 m)   Wt 233 lb 9.6 oz (106 kg)   BMI 33.52 kg/m   General Appearance:    Alert, cooperative, no distress, appears stated age  Head:    Normocephalic, without obvious abnormality, atraumatic  Eyes:    PERRL, conjunctiva/corneas clear, EOM's intact, fundi    benign  Ears:    Normal TM's and external ear canals  Nose:   Mask in place   Throat:   Mask in place   Neck:   Supple, no lymphadenopathy;  thyroid:  no   enlargement/tenderness/nodules; no carotid   bruit or JVD  Back:    Spine nontender, no curvature, ROM normal, no CVA     tenderness  Lungs:     Clear to auscultation bilaterally without wheezes, rales or     ronchi; respirations unlabored  Chest Wall:    No tenderness or deformity   Heart:    Regular rate and rhythm, S1 and S2 normal, no murmur, rub   or gallop  Breast Exam:    No chest wall tenderness, masses or gynecomastia  Abdomen:     Soft, non-tender, nondistended, normoactive bowel sounds,    no masses, no hepatosplenomegaly  Genitalia:    Declines      Extremities:   No clubbing, cyanosis or edema  Pulses:   2+ and symmetric all extremities  Skin:   Skin color, texture, turgor normal, no rashes or lesions  Lymph nodes:   Cervical, supraclavicular, and axillary nodes normal  Neurologic:   CNII-XII intact, normal strength, sensation and gait; reflexes 2+ and symmetric throughout  Psych:   Normal mood, affect, hygiene and grooming.        Assessment & Plan:  Routine general medical examination at a health care facility - Plan: Visual acuity screening, CBC with Differential/Platelet, Comprehensive metabolic panel, TSH, T4, free-here today for a fasting CPE.  He is doing well overall.  Immunizations discussed and updated.  Discussed safety and health promotion.  Advised self testicular exams.  Declines dental exam today.  Moderate persistent asthma without complication-asthma appears to be well controlled  now.  Breo sample was given.  Continue with daily maintenance inhaler and let me know if he is needing his albuterol more often.  He may want to consider taking a nondrowsy antihistamine  Fatty liver-discussed potential long-term worsening liver health related to this.  He is exercising and avoiding alcohol and NSAIDs.  Elevated LFTs-he has cut back significantly on alcohol.  No NSAIDs.  Most likely related to fatty liver.  Continue to monitor  Mixed hyperlipidemia - Plan: Lipid panel-good compliance with statin and no side effects.  Continue statin and follow-up pending results  Screening for thyroid disorder  Screening for HIV without presence of risk factors - Plan: HIV Antibody (routine testing w rflx)-done per guidelines  Medication management - Plan: VITAMIN D 25 Hydroxy (Vit-D Deficiency, Fractures), Lipid panel-adjust medications as appropriate He has been taking vitamin D supplement for months without having his vitamin D level checked  Need for Tdap vaccination - Plan: Tdap vaccine greater than or equal to 7yo IM-counseling done on all components of vaccine  Needs flu shot

## 2018-09-12 NOTE — Patient Instructions (Signed)
Preventive Care 40-42 Years Old, Male Preventive care refers to lifestyle choices and visits with your health care provider that can promote health and wellness. This includes:  A yearly physical exam. This is also called an annual well check.  Regular dental and eye exams.  Immunizations.  Screening for certain conditions.  Healthy lifestyle choices, such as eating a healthy diet, getting regular exercise, not using drugs or products that contain nicotine and tobacco, and limiting alcohol use. What can I expect for my preventive care visit? Physical exam Your health care provider will check:  Height and weight. These may be used to calculate body mass index (BMI), which is a measurement that tells if you are at a healthy weight.  Heart rate and blood pressure.  Your skin for abnormal spots. Counseling Your health care provider may ask you questions about:  Alcohol, tobacco, and drug use.  Emotional well-being.  Home and relationship well-being.  Sexual activity.  Eating habits.  Work and work environment. What immunizations do I need?  Influenza (flu) vaccine  This is recommended every year. Tetanus, diphtheria, and pertussis (Tdap) vaccine  You may need a Td booster every 10 years. Varicella (chickenpox) vaccine  You may need this vaccine if you have not already been vaccinated. Zoster (shingles) vaccine  You may need this after age 60. Measles, mumps, and rubella (MMR) vaccine  You may need at least one dose of MMR if you were born in 1957 or later. You may also need a second dose. Pneumococcal conjugate (PCV13) vaccine  You may need this if you have certain conditions and were not previously vaccinated. Pneumococcal polysaccharide (PPSV23) vaccine  You may need one or two doses if you smoke cigarettes or if you have certain conditions. Meningococcal conjugate (MenACWY) vaccine  You may need this if you have certain conditions. Hepatitis A vaccine   You may need this if you have certain conditions or if you travel or work in places where you may be exposed to hepatitis A. Hepatitis B vaccine  You may need this if you have certain conditions or if you travel or work in places where you may be exposed to hepatitis B. Haemophilus influenzae type b (Hib) vaccine  You may need this if you have certain risk factors. Human papillomavirus (HPV) vaccine  If recommended by your health care provider, you may need three doses over 6 months. You may receive vaccines as individual doses or as more than one vaccine together in one shot (combination vaccines). Talk with your health care provider about the risks and benefits of combination vaccines. What tests do I need? Blood tests  Lipid and cholesterol levels. These may be checked every 5 years, or more frequently if you are over 50 years old.  Hepatitis C test.  Hepatitis B test. Screening  Lung cancer screening. You may have this screening every year starting at age 55 if you have a 30-pack-year history of smoking and currently smoke or have quit within the past 15 years.  Prostate cancer screening. Recommendations will vary depending on your family history and other risks.  Colorectal cancer screening. All adults should have this screening starting at age 50 and continuing until age 75. Your health care provider may recommend screening at age 45 if you are at increased risk. You will have tests every 1-10 years, depending on your results and the type of screening test.  Diabetes screening. This is done by checking your blood sugar (glucose) after you have not eaten   for a while (fasting). You may have this done every 1-3 years.  Sexually transmitted disease (STD) testing. Follow these instructions at home: Eating and drinking  Eat a diet that includes fresh fruits and vegetables, whole grains, lean protein, and low-fat dairy products.  Take vitamin and mineral supplements as recommended  by your health care provider.  Do not drink alcohol if your health care provider tells you not to drink.  If you drink alcohol: ? Limit how much you have to 0-2 drinks a day. ? Be aware of how much alcohol is in your drink. In the [place] one drink equals one 12 oz bottle of beer (355 mL), one 5 oz glass of wine (148 mL), or one 1 oz glass of hard liquor (44 mL). Lifestyle  Take daily care of your teeth and gums.  Stay active. Exercise for at least 30 minutes on 5 or more days each week.  Do not use any products that contain nicotine or tobacco, such as cigarettes, e-cigarettes, and chewing tobacco. If you need help quitting, ask your health care provider.  If you are sexually active, practice safe sex. Use a condom or other form of protection to prevent STIs (sexually transmitted infections).  Talk with your health care provider about taking a low-dose aspirin every day starting at age 33. What's next?  Go to your health care provider once a year for a well check visit.  Ask your health care provider how often you should have your eyes and teeth checked.  Stay up to date on all vaccines. This information is not intended to replace advice given to you by your health care provider. Make sure you discuss any questions you have with your health care provider. Document Released: 01/31/2015 Document Revised: 12/29/2017 Document Reviewed: 12/29/2017 Elsevier Patient Education  2020 [employer].

## 2018-09-13 ENCOUNTER — Encounter: Payer: Self-pay | Admitting: Family Medicine

## 2018-09-13 ENCOUNTER — Other Ambulatory Visit: Payer: Self-pay

## 2018-09-13 ENCOUNTER — Ambulatory Visit: Payer: BC Managed Care – PPO | Admitting: Family Medicine

## 2018-09-13 VITALS — BP 110/78 | HR 72 | Temp 98.4°F | Ht 70.0 in | Wt 233.6 lb

## 2018-09-13 DIAGNOSIS — Z114 Encounter for screening for human immunodeficiency virus [HIV]: Secondary | ICD-10-CM

## 2018-09-13 DIAGNOSIS — R7989 Other specified abnormal findings of blood chemistry: Secondary | ICD-10-CM

## 2018-09-13 DIAGNOSIS — Z1329 Encounter for screening for other suspected endocrine disorder: Secondary | ICD-10-CM

## 2018-09-13 DIAGNOSIS — Z23 Encounter for immunization: Secondary | ICD-10-CM

## 2018-09-13 DIAGNOSIS — Z79899 Other long term (current) drug therapy: Secondary | ICD-10-CM

## 2018-09-13 DIAGNOSIS — Z0000 Encounter for general adult medical examination without abnormal findings: Secondary | ICD-10-CM | POA: Diagnosis not present

## 2018-09-13 DIAGNOSIS — R945 Abnormal results of liver function studies: Secondary | ICD-10-CM | POA: Diagnosis not present

## 2018-09-13 DIAGNOSIS — K760 Fatty (change of) liver, not elsewhere classified: Secondary | ICD-10-CM

## 2018-09-13 DIAGNOSIS — E782 Mixed hyperlipidemia: Secondary | ICD-10-CM | POA: Diagnosis not present

## 2018-09-13 DIAGNOSIS — J4540 Moderate persistent asthma, uncomplicated: Secondary | ICD-10-CM | POA: Diagnosis not present

## 2018-09-14 LAB — CBC WITH DIFFERENTIAL/PLATELET
Basophils Absolute: 0.1 10*3/uL (ref 0.0–0.2)
Basos: 1 %
EOS (ABSOLUTE): 0.2 10*3/uL (ref 0.0–0.4)
Eos: 2 %
Hematocrit: 49.2 % (ref 37.5–51.0)
Hemoglobin: 17.1 g/dL (ref 13.0–17.7)
Immature Grans (Abs): 0 10*3/uL (ref 0.0–0.1)
Immature Granulocytes: 0 %
Lymphocytes Absolute: 1.6 10*3/uL (ref 0.7–3.1)
Lymphs: 23 %
MCH: 32.4 pg (ref 26.6–33.0)
MCHC: 34.8 g/dL (ref 31.5–35.7)
MCV: 93 fL (ref 79–97)
Monocytes Absolute: 0.4 10*3/uL (ref 0.1–0.9)
Monocytes: 6 %
Neutrophils Absolute: 4.5 10*3/uL (ref 1.4–7.0)
Neutrophils: 68 %
Platelets: 262 10*3/uL (ref 150–450)
RBC: 5.28 x10E6/uL (ref 4.14–5.80)
RDW: 12.9 % (ref 11.6–15.4)
WBC: 6.6 10*3/uL (ref 3.4–10.8)

## 2018-09-14 LAB — COMPREHENSIVE METABOLIC PANEL
ALT: 90 IU/L — ABNORMAL HIGH (ref 0–44)
AST: 59 IU/L — ABNORMAL HIGH (ref 0–40)
Albumin/Globulin Ratio: 2 (ref 1.2–2.2)
Albumin: 4.9 g/dL (ref 4.0–5.0)
Alkaline Phosphatase: 75 IU/L (ref 39–117)
BUN/Creatinine Ratio: 13 (ref 9–20)
BUN: 14 mg/dL (ref 6–24)
Bilirubin Total: 1.1 mg/dL (ref 0.0–1.2)
CO2: 21 mmol/L (ref 20–29)
Calcium: 10.2 mg/dL (ref 8.7–10.2)
Chloride: 101 mmol/L (ref 96–106)
Creatinine, Ser: 1.05 mg/dL (ref 0.76–1.27)
GFR calc Af Amer: 101 mL/min/{1.73_m2} (ref >59)
GFR calc non Af Amer: 87 mL/min/{1.73_m2} (ref >59)
Globulin, Total: 2.5 g/dL (ref 1.5–4.5)
Glucose: 101 mg/dL — ABNORMAL HIGH (ref 65–99)
Potassium: 5 mmol/L (ref 3.5–5.2)
Sodium: 142 mmol/L (ref 134–144)
Total Protein: 7.4 g/dL (ref 6.0–8.5)

## 2018-09-14 LAB — LIPID PANEL
Chol/HDL Ratio: 4.9 ratio (ref 0.0–5.0)
Cholesterol, Total: 196 mg/dL (ref 100–199)
HDL: 40 mg/dL (ref >39)
LDL Calculated: 109 mg/dL — ABNORMAL HIGH (ref 0–99)
Triglycerides: 234 mg/dL — ABNORMAL HIGH (ref 0–149)
VLDL Cholesterol Cal: 47 mg/dL — ABNORMAL HIGH (ref 5–40)

## 2018-09-14 LAB — T4, FREE: Free T4: 1.15 ng/dL (ref 0.82–1.77)

## 2018-09-14 LAB — VITAMIN D 25 HYDROXY (VIT D DEFICIENCY, FRACTURES): Vit D, 25-Hydroxy: 34.4 ng/mL (ref 30.0–100.0)

## 2018-09-14 LAB — HIV ANTIBODY (ROUTINE TESTING W REFLEX): HIV Screen 4th Generation wRfx: NONREACTIVE

## 2018-09-14 LAB — TSH: TSH: 1.92 u[IU]/mL (ref 0.450–4.500)

## 2018-09-18 ENCOUNTER — Other Ambulatory Visit: Payer: Self-pay | Admitting: Internal Medicine

## 2018-09-18 ENCOUNTER — Encounter: Payer: Self-pay | Admitting: Internal Medicine

## 2018-09-18 DIAGNOSIS — K760 Fatty (change of) liver, not elsewhere classified: Secondary | ICD-10-CM

## 2018-09-18 DIAGNOSIS — R7989 Other specified abnormal findings of blood chemistry: Secondary | ICD-10-CM

## 2018-09-19 ENCOUNTER — Encounter: Payer: Self-pay | Admitting: Nurse Practitioner

## 2018-10-04 ENCOUNTER — Encounter: Payer: Self-pay | Admitting: Nurse Practitioner

## 2018-10-04 ENCOUNTER — Ambulatory Visit: Payer: BC Managed Care – PPO | Admitting: Nurse Practitioner

## 2018-10-04 ENCOUNTER — Other Ambulatory Visit (INDEPENDENT_AMBULATORY_CARE_PROVIDER_SITE_OTHER): Payer: BC Managed Care – PPO

## 2018-10-04 VITALS — BP 120/90 | HR 88 | Temp 98.7°F | Ht 70.5 in | Wt 233.1 lb

## 2018-10-04 DIAGNOSIS — R945 Abnormal results of liver function studies: Secondary | ICD-10-CM

## 2018-10-04 LAB — IGA: IgA: 176 mg/dL (ref 68–378)

## 2018-10-04 LAB — FERRITIN: Ferritin: 285.7 ng/mL (ref 22.0–322.0)

## 2018-10-04 NOTE — Patient Instructions (Addendum)
If you are age 42 or older, your body mass index should be between 23-30. Your Body mass index is 32.98 kg/m. If this is out of the aforementioned range listed, please consider follow up with your Primary Care Provider.  If you are age 56 or younger, your body mass index should be between 19-25. Your Body mass index is 32.98 kg/m. If this is out of the aformentioned range listed, please consider follow up with your Primary Care Provider.   Your provider has requested that you go to the basement level for lab work before leaving today. Press "B" on the elevator. The lab is located at the first door on the left as you exit the elevator.  Liver test in 3 months.  This order has been placed for January 03, 2019  Work on weight loss.  Follow up with Dr. Hilarie Fredrickson after liver function test.  Please call the office as the schedule is not available at this time.  We will call you with results.  Thank you for choosing me and [facility].   Tye Savoy, NP

## 2018-10-04 NOTE — Progress Notes (Signed)
ASSESSMENT / PLAN:   43 yo male with chronically abnormal liver tests (? Dating back to 2018 in [place]). Liver enzymes abnormalities probably secondary to know fatty liver disease demonstrated on [place] but need to rule out viral  / autoimmune / genetic  / metabolic causes. Of note, liver enzymes were elevated prior to starting Crestor --Obtain labs for  Hepatitis A ( IgM and IgG) Hepatitis B surface antigen Hepatitis B surface antibody Hepatitis B core ab HCV antibody Ferritin, TIBC ANA, AMA, Anti-smooth muscle antibody Alpha 1 antitrypsin Ceruloplasm TTG, total IgA --Discussed role of weight loss in management of hepatic steatosis. He has gained ~ 30 pounds over the last year.  -Currently consumes 1-2 shots of liquor a day. Intake should certainly not exceed this amount -Triglycerides 234. Could be better but not terrible.  --Will call him with results as they become available.  --Repeat liver tests in 3 months followed by ROV.  --Consider liver biopsy if above studies negative and liver tests remain abnormal after weight loss   HPI:    Chief Complaint:   Abnormal liver tests.   Patient is a 43 yo male with PMH of hyperlipidemia, fatty liver disease and appendectomy. He is referred by PCP for abnormal liver tests.  He believes the abnormalities were found in 2018 by physician in [place].  No work-up was done at the time.  Patient relocated to [facility] and since being here has gained an excessive amount of weight.  Patient has fatty liver by ultrasound.  In 2019 he and his wife had a baby and shortly afterwards she suffered a stroke so there hasn't been much time for patient to exercise but he feels okay.  No abdominal pain, no history of jaundice or dark urine.  No known family history of liver disease.   He averages 1-2 shots of liquor a day.  No herbal supplements.  He has been on Crestor for about 1 year. No significant NSAID use. Patient has no chronic GI  problems.  Data Reviewed:  09/01/2017 tbili 1.8, AST 26 / ALT 34  04/03/18 T bili normal, AST 41/ALT 73  06/07/2018 AST 39 / ALT 66  09/13/2018 AST 59 / ALT 90  Abdominal ultrasound 06/05/2018 Fatty liver.  CBD 2 mm.  Gallbladder unremarkable   Past Medical History:  Diagnosis Date  . Childhood asthma   . Elevated LFTs   . Fatty liver 06/05/2018  . Former smoker   . Hyperlipidemia     Past Surgical History:  Procedure Laterality Date  . LAPAROSCOPIC APPENDECTOMY N/A 09/01/2017   Procedure: APPENDECTOMY LAPAROSCOPIC;  Surgeon: Donnie Mesa, MD;  Location: [facility] OR;  Service: General;  Laterality: N/A;   Family History  Problem Relation Age of Onset  . Hyperlipidemia Mother   . Heart disease Father 40       bypass  . Hyperlipidemia Father   . Cancer Maternal Grandmother        pancreas?  . Lung cancer Maternal Grandfather    Social History   Tobacco Use  . Smoking status: Former Smoker    Packs/day: 0.50    Years: 15.00    Pack years: 7.50  . Smokeless tobacco: Never Used  Substance Use Topics  . Alcohol use: Yes    Comment: most days   . Drug use: Never   Current Outpatient Medications  Medication Sig Dispense Refill  . albuterol (VENTOLIN  HFA) 108 (90 Base) MCG/ACT inhaler INHALE 2 PUFFS INTO THE LUNGS EVERY 6 HOURS AS NEEDED FOR WHEEZING OR SHORTNESS OF BREATH 6.7 g 0  . Cholecalciferol (VITAMIN D PO) Take 1 tablet by mouth daily.    . fluticasone furoate-vilanterol (BREO ELLIPTA) 200-25 MCG/INH AEPB Inhale 1 puff into the lungs daily. 1 each 2  . ketoconazole (NIZORAL) 2 % shampoo     . Multiple Vitamin (MULTIVITAMIN WITH MINERALS) TABS tablet Take 1 tablet by mouth daily.    . rosuvastatin (CRESTOR) 20 MG tablet TAKE 1 TABLET(20 MG) BY MOUTH DAILY 30 tablet 2   No current facility-administered medications for this visit.    No Known Allergies  Review of Systems: All systems reviewed and negative except where noted in HPI.   Physical Exam:    Wt  Readings from Last 3 Encounters:  10/04/18 233 lb 2 oz (105.7 kg)  09/13/18 233 lb 9.6 oz (106 kg)  06/26/18 234 lb 12.8 oz (106.5 kg)    BP 120/90   Pulse 88   Temp 98.7 F (37.1 C)   Ht 5' 10.5" (1.791 m)   Wt 233 lb 2 oz (105.7 kg)   BMI 32.98 kg/m  Constitutional:  Pleasant well developed male in no acute distress. Psychiatric: Normal mood and affect. Behavior is normal. EENT: Pupils normal.  Conjunctivae are normal. No scleral icterus. Neck supple.  Cardiovascular: Normal rate, regular rhythm. No edema Pulmonary/chest: Effort normal and breath sounds normal. No wheezing, rales or rhonchi. Abdominal: Soft, nondistended, nontender. Bowel sounds active throughout. There are no masses palpable. No hepatomegaly. Neurological: Alert and oriented to person place and time. Skin: Skin is warm and dry. No rashes noted.  Willette ClusterPaula Stephens Shreve, NP  10/04/2018, 9:39 AM  Cc: Henson, Vickie L, NP-C

## 2018-10-06 LAB — ANA: Anti Nuclear Antibody (ANA): NEGATIVE

## 2018-10-06 LAB — TISSUE TRANSGLUTAMINASE, IGA: (tTG) Ab, IgA: 1 U/mL

## 2018-10-06 LAB — MITOCHONDRIAL ANTIBODIES: Mitochondrial M2 Ab, IgG: <=20 U

## 2018-10-06 LAB — HEPATITIS C ANTIBODY
Hepatitis C Ab: NONREACTIVE
SIGNAL TO CUT-OFF: 0.02 (ref <1.00)

## 2018-10-06 LAB — HEPATITIS B SURFACE ANTIGEN: Hepatitis B Surface Ag: NONREACTIVE

## 2018-10-06 LAB — ANTI-SMOOTH MUSCLE ANTIBODY, IGG: Actin (Smooth Muscle) Antibody (IGG): <20 U (ref <20)

## 2018-10-06 LAB — ALPHA-1-ANTITRYPSIN: A-1 Antitrypsin, Ser: 124 mg/dL (ref 83–199)

## 2018-10-06 LAB — HEPATITIS A ANTIBODY, TOTAL: Hepatitis A AB,Total: NONREACTIVE

## 2018-10-06 LAB — IRON, TOTAL/TOTAL IRON BINDING CAP
%SAT: 37 % (calc) (ref 20–48)
Iron: 132 ug/dL (ref 50–180)
TIBC: 353 mcg/dL (calc) (ref 250–425)

## 2018-10-06 LAB — CERULOPLASMIN: Ceruloplasmin: 30 mg/dL (ref 18–36)

## 2018-10-06 LAB — HEPATITIS B SURFACE ANTIBODY,QUALITATIVE: Hep B S Ab: NONREACTIVE

## 2018-10-18 NOTE — Progress Notes (Signed)
Addendum: Reviewed and agree with assessment and management plan. Angelo Prindle M, MD  

## 2018-10-24 ENCOUNTER — Other Ambulatory Visit: Payer: Self-pay | Admitting: Family Medicine

## 2018-10-24 DIAGNOSIS — E782 Mixed hyperlipidemia: Secondary | ICD-10-CM

## 2018-10-24 DIAGNOSIS — J4540 Moderate persistent asthma, uncomplicated: Secondary | ICD-10-CM

## 2018-10-24 NOTE — Telephone Encounter (Signed)
Is this okay to refill? 

## 2018-12-23 ENCOUNTER — Other Ambulatory Visit: Payer: Self-pay | Admitting: Family Medicine

## 2018-12-23 DIAGNOSIS — J4540 Moderate persistent asthma, uncomplicated: Secondary | ICD-10-CM

## 2019-01-09 ENCOUNTER — Telehealth: Payer: Self-pay | Admitting: Family Medicine

## 2019-01-09 NOTE — Telephone Encounter (Signed)
Pt has concerns about cold symptoms  They just started today He is having   Runny nose, sneezing  No known exposures, working from home  He wants to know if he should be tested

## 2019-01-09 NOTE — Telephone Encounter (Signed)
Pt was notified and was going to look on website about getting an appt for testing

## 2019-01-09 NOTE — Telephone Encounter (Signed)
I am ok with him getting a Covid-19 test. It is up to him as to whether he wants to give it more time or go for testing. Please advise him as to where to get it. Thanks.

## 2019-01-10 ENCOUNTER — Other Ambulatory Visit: Payer: BC Managed Care – PPO

## 2019-01-19 HISTORY — PX: TENDON REPAIR: SHX5111

## 2019-01-22 ENCOUNTER — Telehealth: Payer: Self-pay

## 2019-01-22 NOTE — Telephone Encounter (Signed)
Spoke with the patient and reminded he is due his labs.

## 2019-01-22 NOTE — Telephone Encounter (Signed)
-----   Message from West Carbo, RN sent at 10/09/2018  4:38 PM EDT ----- Remind the patient to come in for HPF (3 month check).

## 2019-03-27 ENCOUNTER — Other Ambulatory Visit: Payer: Self-pay | Admitting: Family Medicine

## 2019-03-27 DIAGNOSIS — J4540 Moderate persistent asthma, uncomplicated: Secondary | ICD-10-CM

## 2019-03-27 NOTE — Telephone Encounter (Signed)
Needs office visit. Overdue. Ok to give him 30 days of [employer]

## 2019-03-27 NOTE — Telephone Encounter (Signed)
Pt has an appt in may °

## 2019-03-27 NOTE — Telephone Encounter (Signed)
Is this okay to refill? 

## 2019-04-03 ENCOUNTER — Other Ambulatory Visit: Payer: Self-pay | Admitting: Family Medicine

## 2019-04-03 DIAGNOSIS — E782 Mixed hyperlipidemia: Secondary | ICD-10-CM

## 2019-04-03 NOTE — Telephone Encounter (Signed)
Pt is on crestor now

## 2019-04-12 ENCOUNTER — Ambulatory Visit: Payer: BC Managed Care – PPO | Attending: Internal Medicine

## 2019-04-12 DIAGNOSIS — Z23 Encounter for immunization: Secondary | ICD-10-CM

## 2019-04-12 NOTE — Progress Notes (Signed)
   Covid-19 Vaccination Clinic  Name:  Yossi Hinchman    MRN: [id number] DOB: October 01, 1975  04/12/2019  Mr. Heuring was observed post Covid-19 immunization for 15 minutes without incident. He was provided with Vaccine Information Sheet and instruction to access the V-Safe system.   Mr. Guzzetta was instructed to call 911 with any severe reactions post vaccine: Marland Kitchen Difficulty breathing  . Swelling of face and throat  . A fast heartbeat  . A bad rash all over body  . Dizziness and weakness   Immunizations Administered    Name Date Dose VIS Date Route   Pfizer COVID-19 Vaccine 04/12/2019 10:49 AM 0.3 mL 12/29/2018 Intramuscular   Manufacturer: [employer], [employer]   Lot: [id number]   NDC: [address]-5

## 2019-05-07 ENCOUNTER — Ambulatory Visit: Payer: BC Managed Care – PPO | Attending: Internal Medicine

## 2019-05-07 DIAGNOSIS — Z23 Encounter for immunization: Secondary | ICD-10-CM

## 2019-05-07 NOTE — Progress Notes (Signed)
   Covid-19 Vaccination Clinic  Name:  [facility]    MRN: [id number] DOB: Jun 13, 1975  05/07/2019  [name] was observed post Covid-19 immunization for 15 minutes without incident. He was provided with Vaccine Information Sheet and instruction to access the V-Safe system.   [name] was instructed to call 911 with any severe reactions post vaccine: Marland Kitchen Difficulty breathing  . Swelling of face and throat  . A fast heartbeat  . A bad rash all over body  . Dizziness and weakness   Immunizations Administered    Name Date Dose VIS Date Route   Pfizer COVID-19 Vaccine 05/07/2019 10:06 AM 0.3 mL 03/14/2018 Intramuscular   Manufacturer: [employer], [employer]   Lot: [id number]   NDC: [address]-3

## 2019-05-30 ENCOUNTER — Encounter: Payer: Self-pay | Admitting: Family Medicine

## 2019-05-30 ENCOUNTER — Other Ambulatory Visit: Payer: Self-pay

## 2019-05-30 ENCOUNTER — Ambulatory Visit: Payer: Managed Care, Other (non HMO) | Admitting: Family Medicine

## 2019-05-30 VITALS — BP 130/80 | HR 70 | Temp 97.5°F | Resp 16 | Wt 242.6 lb

## 2019-05-30 DIAGNOSIS — R5383 Other fatigue: Secondary | ICD-10-CM

## 2019-05-30 DIAGNOSIS — R6882 Decreased libido: Secondary | ICD-10-CM

## 2019-05-30 DIAGNOSIS — R7989 Other specified abnormal findings of blood chemistry: Secondary | ICD-10-CM

## 2019-05-30 DIAGNOSIS — E669 Obesity, unspecified: Secondary | ICD-10-CM

## 2019-05-30 DIAGNOSIS — Z9189 Other specified personal risk factors, not elsewhere classified: Secondary | ICD-10-CM

## 2019-05-30 DIAGNOSIS — R635 Abnormal weight gain: Secondary | ICD-10-CM | POA: Diagnosis not present

## 2019-05-30 DIAGNOSIS — K760 Fatty (change of) liver, not elsewhere classified: Secondary | ICD-10-CM

## 2019-05-30 DIAGNOSIS — E782 Mixed hyperlipidemia: Secondary | ICD-10-CM

## 2019-05-30 DIAGNOSIS — J4540 Moderate persistent asthma, uncomplicated: Secondary | ICD-10-CM

## 2019-05-30 DIAGNOSIS — R0683 Snoring: Secondary | ICD-10-CM

## 2019-05-30 DIAGNOSIS — E66811 Obesity, class 1: Secondary | ICD-10-CM

## 2019-05-30 MED ORDER — ALBUTEROL SULFATE HFA 108 (90 BASE) MCG/ACT IN AERS: INHALATION_SPRAY | RESPIRATORY_TRACT | 1 refills | Status: DC

## 2019-05-30 MED ORDER — BREO ELLIPTA 200-25 MCG/INH IN AEPB: INHALATION_SPRAY | RESPIRATORY_TRACT | 1 refills | Status: DC

## 2019-05-30 NOTE — Patient Instructions (Addendum)
Start back on your Breo inhaler.  Make sure you are rinsing your mouth out after each use.  I will be in touch with your lab results and refill your statin as indicated.  You should hear from [facility] regarding your home sleep study  Please call and schedule a follow up with your gastroenterologist as recommended.   Try to increase your physical activity and eat a low fat, low carbohydrate diet.

## 2019-05-30 NOTE — Progress Notes (Signed)
Subjective:    Patient ID: David Kent, male    DOB: 1975/12/25, 44 y.o.   MRN: [id number]  HPI Chief Complaint  Patient presents with  . med check    med check- follow-up on asthma. been without meds so having some flare ups   Here for medication management visit. States his asthma was well controlled when using Breo inhaler.  He did not need albuterol. States he ran out of [place] approximately 1 month ago.  Since then, he has been using his albuterol inhaler more often.  Denies any recent asthma flares or visits to urgent care or ED.  States he received [employer] Covid vaccines.  Statin -reports taking his statin daily up until he ran out one month ago.   He has a history of fatty liver disease and elevated liver function test.  He has not yet followed up with GI as recommended but plans to do so.  Reports cutting back on alcohol since our last visit.  States he is only drinking 1-2 alcoholic drinks per week now.  He has a new concern today regarding recent weight gain, fatigue and decreased libido.  He denies a history of sleep apnea.  States his wife recently told him that he snores and occasionally stops breathing.   States he does not feel like he eats enough food to make him continue to gain weight.  He has not been exercising lately.  States this is due to fatigue.  States he is working from home and he is in front of his computer most of the day.  Requests to have his testosterone level checked.  Denies fever, chills, headache, dizziness, chest pain, palpitations, orthopnea, shortness of breath, abdominal pain, nausea, vomiting, diarrhea, urinary symptoms, or LE edema  Reviewed allergies, medications, past medical, surgical, family, and social history.    Review of Systems Pertinent positives and negatives in the history of present illness.     Objective:   Physical Exam BP 130/80   Pulse 70   Temp (!) 97.5 F (36.4 C)   Resp 16   Wt 242 lb 9.6 oz (110 kg)   SpO2  97%   BMI 34.32 kg/m   Alert and in no distress.  Pharyngeal area is normal. Neck is supple without adenopathy or thyromegaly. Cardiac exam shows a regular sinus rhythm without murmurs or gallops. Lungs are clear to auscultation.  Extremities without edema.  Skin is warm and dry.  PERRLA, EOMs intact.      Assessment & Plan:  Moderate persistent asthma without complication - Plan: fluticasone furoate-vilanterol (BREO ELLIPTA) 200-25 MCG/INH AEPB, albuterol (VENTOLIN HFA) 108 (90 Base) MCG/ACT inhaler -he was doing well on Breo until he ran out. I will refill his Breo and albuterol and he will follow up in 4 weeks  Mixed hyperlipidemia - Plan: Lipid panel -has been out of his statin for one month. Check lipids and follow up  Elevated LFTs - Plan: Comprehensive metabolic panel -recheck today since we are checking labs and he will follow up with GI as recommended. He reports cutting back on alcohol. recommend avoiding alcohol.   Weight gain - Plan: TSH, T4, free, T3, Home sleep test, Testosterone  Fatty liver -counseling on low fat diet and weight loss. Avoid alcohol. Follow up with GI.   Obesity (BMI 30.0-34.9) - Plan: TSH, T4, free, T3, Testosterone -Counseling on eating small frequent meals instead of skipping meals, making healthy food choices and increasing physical activity.  Check labs in follow-up  Snoring - Plan: Home sleep test  At risk for sleep apnea - Plan: Home sleep test  Fatigue, unspecified type - Plan: CBC with Differential/Platelet, Comprehensive metabolic panel, TSH, T4, free, T3, VITAMIN D 25 Hydroxy (Vit-D Deficiency, Fractures), Vitamin B12, Home sleep test, Testosterone  Decreased libido - Plan: Testosterone

## 2019-05-31 ENCOUNTER — Encounter: Payer: Self-pay | Admitting: Family Medicine

## 2019-05-31 DIAGNOSIS — R7989 Other specified abnormal findings of blood chemistry: Secondary | ICD-10-CM

## 2019-05-31 DIAGNOSIS — E559 Vitamin D deficiency, unspecified: Secondary | ICD-10-CM

## 2019-05-31 HISTORY — DX: Vitamin D deficiency, unspecified: E55.9

## 2019-05-31 HISTORY — DX: Other specified abnormal findings of blood chemistry: R79.89

## 2019-05-31 LAB — LIPID PANEL
Chol/HDL Ratio: 9.9 ratio — ABNORMAL HIGH (ref 0.0–5.0)
Cholesterol, Total: 328 mg/dL — ABNORMAL HIGH (ref 100–199)
HDL: 33 mg/dL — ABNORMAL LOW (ref >39)
LDL Chol Calc (NIH): 235 mg/dL — ABNORMAL HIGH (ref 0–99)
Triglycerides: 283 mg/dL — ABNORMAL HIGH (ref 0–149)
VLDL Cholesterol Cal: 60 mg/dL — ABNORMAL HIGH (ref 5–40)

## 2019-05-31 LAB — COMPREHENSIVE METABOLIC PANEL
ALT: 49 IU/L — ABNORMAL HIGH (ref 0–44)
AST: 36 IU/L (ref 0–40)
Albumin/Globulin Ratio: 2 (ref 1.2–2.2)
Albumin: 4.7 g/dL (ref 4.0–5.0)
Alkaline Phosphatase: 72 IU/L (ref 39–117)
BUN/Creatinine Ratio: 13 (ref 9–20)
BUN: 16 mg/dL (ref 6–24)
Bilirubin Total: 0.8 mg/dL (ref 0.0–1.2)
CO2: 21 mmol/L (ref 20–29)
Calcium: 9.7 mg/dL (ref 8.7–10.2)
Chloride: 102 mmol/L (ref 96–106)
Creatinine, Ser: 1.21 mg/dL (ref 0.76–1.27)
GFR calc Af Amer: 84 mL/min/{1.73_m2} (ref >59)
GFR calc non Af Amer: 73 mL/min/{1.73_m2} (ref >59)
Globulin, Total: 2.3 g/dL (ref 1.5–4.5)
Glucose: 98 mg/dL (ref 65–99)
Potassium: 4.6 mmol/L (ref 3.5–5.2)
Sodium: 139 mmol/L (ref 134–144)
Total Protein: 7 g/dL (ref 6.0–8.5)

## 2019-05-31 LAB — CBC WITH DIFFERENTIAL/PLATELET
Basophils Absolute: 0 10*3/uL (ref 0.0–0.2)
Basos: 1 %
EOS (ABSOLUTE): 0.2 10*3/uL (ref 0.0–0.4)
Eos: 3 %
Hematocrit: 50.3 % (ref 37.5–51.0)
Hemoglobin: 17.8 g/dL — ABNORMAL HIGH (ref 13.0–17.7)
Immature Grans (Abs): 0 10*3/uL (ref 0.0–0.1)
Immature Granulocytes: 1 %
Lymphocytes Absolute: 1.7 10*3/uL (ref 0.7–3.1)
Lymphs: 30 %
MCH: 33.1 pg — ABNORMAL HIGH (ref 26.6–33.0)
MCHC: 35.4 g/dL (ref 31.5–35.7)
MCV: 94 fL (ref 79–97)
Monocytes Absolute: 0.4 10*3/uL (ref 0.1–0.9)
Monocytes: 7 %
Neutrophils Absolute: 3.3 10*3/uL (ref 1.4–7.0)
Neutrophils: 58 %
Platelets: 317 10*3/uL (ref 150–450)
RBC: 5.37 x10E6/uL (ref 4.14–5.80)
RDW: 13 % (ref 11.6–15.4)
WBC: 5.6 10*3/uL (ref 3.4–10.8)

## 2019-05-31 LAB — T4, FREE: Free T4: 1.08 ng/dL (ref 0.82–1.77)

## 2019-05-31 LAB — VITAMIN B12: Vitamin B-12: 361 pg/mL (ref 232–1245)

## 2019-05-31 LAB — VITAMIN D 25 HYDROXY (VIT D DEFICIENCY, FRACTURES): Vit D, 25-Hydroxy: 20.5 ng/mL — ABNORMAL LOW (ref 30.0–100.0)

## 2019-05-31 LAB — TSH: TSH: 1.67 u[IU]/mL (ref 0.450–4.500)

## 2019-05-31 LAB — T3: T3, Total: 131 ng/dL (ref 71–180)

## 2019-05-31 LAB — TESTOSTERONE: Testosterone: 238 ng/dL — ABNORMAL LOW (ref 264–916)

## 2019-05-31 NOTE — Progress Notes (Signed)
Please refill his statin. Also, call and let him know that we can do a follow up visit in 6 weeks in order to recheck labs (testosterone, vitamin D and cholesterol). He scheduled a 4 week follow up yesterday so we can just change this. Also, I still recommend follow up with his GI. I sent him a message about his lab results.

## 2019-06-07 ENCOUNTER — Telehealth: Payer: Self-pay

## 2019-06-07 MED ORDER — BUDESONIDE-FORMOTEROL FUMARATE 160-4.5 MCG/ACT IN AERO
2.0000 | INHALATION_SPRAY | Freq: Two times a day (BID) | RESPIRATORY_TRACT | 2 refills | Status: DC
Start: 2019-06-07 — End: 2019-09-10

## 2019-06-07 NOTE — Telephone Encounter (Signed)
Sent in med and pt was notified 

## 2019-06-07 NOTE — Telephone Encounter (Signed)
I submitted a PA for the pts. Breo Ellipta and it was denied by [employer] and the covered alternatives are Symbicort 160-4.5, Dulera 200-5, Anoro Ellipta, and trelegy ellipta.

## 2019-06-07 NOTE — Telephone Encounter (Signed)
Ok to switch him to Symbicort 160 mcg/4.5 mcg. Let him know that this is used twice daily whereas the Breo was once daily.  He will need to still rinse out his mouth after each use.

## 2019-06-27 ENCOUNTER — Ambulatory Visit: Payer: Managed Care, Other (non HMO) | Admitting: Family Medicine

## 2019-06-27 ENCOUNTER — Other Ambulatory Visit: Payer: Self-pay | Admitting: Family Medicine

## 2019-06-27 DIAGNOSIS — E782 Mixed hyperlipidemia: Secondary | ICD-10-CM

## 2019-07-09 ENCOUNTER — Ambulatory Visit: Payer: Managed Care, Other (non HMO) | Admitting: Family Medicine

## 2019-07-09 ENCOUNTER — Other Ambulatory Visit: Payer: Self-pay

## 2019-07-09 ENCOUNTER — Encounter: Payer: Self-pay | Admitting: Family Medicine

## 2019-07-09 VITALS — BP 120/80 | HR 69 | Resp 16 | Wt 237.0 lb

## 2019-07-09 DIAGNOSIS — Z79899 Other long term (current) drug therapy: Secondary | ICD-10-CM

## 2019-07-09 DIAGNOSIS — J4540 Moderate persistent asthma, uncomplicated: Secondary | ICD-10-CM | POA: Diagnosis not present

## 2019-07-09 DIAGNOSIS — E782 Mixed hyperlipidemia: Secondary | ICD-10-CM

## 2019-07-09 DIAGNOSIS — R7989 Other specified abnormal findings of blood chemistry: Secondary | ICD-10-CM | POA: Diagnosis not present

## 2019-07-09 DIAGNOSIS — E559 Vitamin D deficiency, unspecified: Secondary | ICD-10-CM | POA: Diagnosis not present

## 2019-07-09 NOTE — Progress Notes (Signed)
   Subjective:    Patient ID: David [facility], male    DOB: Oct 03, 1975, 44 y.o.   MRN: [id number]  HPI Chief Complaint  Patient presents with  . follow-up    follow-up on cholesterol and asthma meds   He is here to follow up on chronic health conditions.   Asthma- using Symbicort twice daily now and is not needing albuterol.   Suspect he has sleep apnea and I ordered a sleep study. He has not yet done this.   Liver enzymes- persistently elevated. Followed by GI  Testosterone- it was low at his last visit. He thinks his fatigue and obesity are related to low testosterone. Reports low libido as well.   Vitamin D- taking a daily supplement daily   Cholesterol improved with statin.   He has lost 5 lbs today. States he is on a strict diet. Low carbs. Has been exercising but doing more weight lifting than cardio. Eating approximately 2,000 calories per day.   Denies fever, chills, dizziness, chest pain, palpitations, shortness of breath, abdominal pain, N/V/D, urinary symptoms, LE edema.     Review of Systems Pertinent positives and negatives in the history of present illness.     Objective:   Physical Exam BP 120/80   Pulse 69   Resp 16   Wt 237 lb (107.5 kg)   SpO2 95%   BMI 33.53 kg/m   Alert and in no distress. Cardiac exam shows a regular rhythm without murmurs or gallops. Lungs are clear to auscultation. Skin is warm and dry.      Assessment & Plan:  Moderate persistent asthma without complication -Asthma is well controlled.  Continue on daily maintenance inhaler.  Let me know if he is using albuterol often.  Mixed hyperlipidemia - Plan: Lipid panel -Continue statin therapy.  Follow-up pending lipid results.  Vitamin D deficiency - Plan: VITAMIN D 25 Hydroxy (Vit-D Deficiency, Fractures) Taking a vitamin D supplement now.  Follow-up pending vitamin D level  Low serum testosterone level - Plan: Testosterone, Free, Total, SHBG -Discussed low testosterone may be  contributing to his fatigue and obesity and that his obesity can also contribute to his low testosterone level.  Check labs and follow-up  Elevated LFTs - Plan: Comprehensive metabolic panel -These had improved previously.  Followed by GI.  Medication management - Plan: VITAMIN D 25 Hydroxy (Vit-D Deficiency, Fractures), Lipid panel -Refill medications pending lab results as appropriate

## 2019-07-09 NOTE — Patient Instructions (Addendum)
[facility]- phone number [phone]

## 2019-07-13 LAB — COMPREHENSIVE METABOLIC PANEL
ALT: 64 IU/L — ABNORMAL HIGH (ref 0–44)
AST: 39 IU/L (ref 0–40)
Albumin/Globulin Ratio: 2.1 (ref 1.2–2.2)
Albumin: 4.8 g/dL (ref 4.0–5.0)
Alkaline Phosphatase: 85 IU/L (ref 48–121)
BUN/Creatinine Ratio: 16 (ref 9–20)
BUN: 21 mg/dL (ref 6–24)
Bilirubin Total: 0.8 mg/dL (ref 0.0–1.2)
CO2: 22 mmol/L (ref 20–29)
Calcium: 9.9 mg/dL (ref 8.7–10.2)
Chloride: 104 mmol/L (ref 96–106)
Creatinine, Ser: 1.33 mg/dL — ABNORMAL HIGH (ref 0.76–1.27)
GFR calc Af Amer: 75 mL/min/{1.73_m2} (ref >59)
GFR calc non Af Amer: 65 mL/min/{1.73_m2} (ref >59)
Globulin, Total: 2.3 g/dL (ref 1.5–4.5)
Glucose: 101 mg/dL — ABNORMAL HIGH (ref 65–99)
Potassium: 4.9 mmol/L (ref 3.5–5.2)
Sodium: 141 mmol/L (ref 134–144)
Total Protein: 7.1 g/dL (ref 6.0–8.5)

## 2019-07-13 LAB — LIPID PANEL
Chol/HDL Ratio: 4.4 ratio (ref 0.0–5.0)
Cholesterol, Total: 189 mg/dL (ref 100–199)
HDL: 43 mg/dL (ref >39)
LDL Chol Calc (NIH): 126 mg/dL — ABNORMAL HIGH (ref 0–99)
Triglycerides: 111 mg/dL (ref 0–149)
VLDL Cholesterol Cal: 20 mg/dL (ref 5–40)

## 2019-07-13 LAB — TESTOSTERONE, FREE, TOTAL, SHBG
Sex Hormone Binding: 22 nmol/L (ref 16.5–55.9)
Testosterone, Free: 9.1 pg/mL (ref 6.8–21.5)
Testosterone: 210 ng/dL — ABNORMAL LOW (ref 264–916)

## 2019-07-13 LAB — VITAMIN D 25 HYDROXY (VIT D DEFICIENCY, FRACTURES): Vit D, 25-Hydroxy: 45 ng/mL (ref 30.0–100.0)

## 2019-07-16 ENCOUNTER — Encounter: Payer: Self-pay | Admitting: Family Medicine

## 2019-07-16 DIAGNOSIS — E291 Testicular hypofunction: Secondary | ICD-10-CM

## 2019-07-16 HISTORY — DX: Testicular hypofunction: E29.1

## 2019-07-16 NOTE — Progress Notes (Signed)
Needs a visit to discuss starting on testosterone replacement therapy if he is interested. Thanks.

## 2019-07-17 NOTE — Progress Notes (Signed)
   Subjective:    Patient ID: David Kent, male    DOB: 1975-09-03, 44 y.o.   MRN: [id number]  HPI Chief Complaint  Patient presents with  . discuss lab test results    discuss testosterone    He is here to follow up on 2 low serum testosterone levels.   Reports increased fatigue, decreased libido and difficulty losing weight.  Requests medication for ED.   Denies history of prostate or breast cancer.   No urinary symptoms.   He is at risk for sleep apnea and has not scheduled home sleep test.   Denies fever, chills, dizziness, chest pain, palpitations, shortness of breath, abdominal pain, N/V/D.   Reviewed allergies, medications, past medical, surgical, family, and social history.   Review of Systems Pertinent positives and negatives in the history of present illness.     Objective:   Physical Exam BP 112/80   Pulse 71   Temp 97.7 F (36.5 C)   Resp 16   Ht 5\' 11"  (1.803 m)   Wt 236 lb 3.2 oz (107.1 kg)   SpO2 98%   BMI 32.94 kg/m   Alert and oriented and in no acute distress.  Not otherwise examined.      Assessment & Plan:  Hypogonadism in male - Plan: FSH/LH, Prolactin, PSA -Counseling on testosterone replacement therapy including risks versus benefit.  He has had 2 low serum testosterone levels, both in the morning.  He does appear symptomatic.  Discussed ruling out any underlying pituitary issue and checking a baseline PSA prior to starting him on testosterone.  We discussed increased risk of DVT, prostate cancer, breast cancer and cardiovascular risks.  No personal family history of any of these.  Erectile dysfunction, unspecified erectile dysfunction type - Plan: FSH/LH, Prolactin, PSA, sildenafil (REVATIO) 20 MG tablet -Counseling on how to take the medication and potential side effects.  Discussed red flag symptoms.  At risk for sleep apnea -Stressed the importance of getting a sleep test to screen for sleep apnea.  Discussed potential long-term health  consequences associated with untreated sleep apnea.  Also discussed that his fatigue and other symptoms could be stemming from sleep apnea.  He is agreeable to call and schedule a sleep study.

## 2019-07-18 ENCOUNTER — Other Ambulatory Visit: Payer: Self-pay

## 2019-07-18 ENCOUNTER — Encounter: Payer: Self-pay | Admitting: Family Medicine

## 2019-07-18 ENCOUNTER — Ambulatory Visit: Payer: Managed Care, Other (non HMO) | Admitting: Family Medicine

## 2019-07-18 ENCOUNTER — Other Ambulatory Visit: Payer: Self-pay | Admitting: Family Medicine

## 2019-07-18 VITALS — BP 112/80 | HR 71 | Temp 97.7°F | Resp 16 | Ht 71.0 in | Wt 236.2 lb

## 2019-07-18 DIAGNOSIS — N529 Male erectile dysfunction, unspecified: Secondary | ICD-10-CM | POA: Diagnosis not present

## 2019-07-18 DIAGNOSIS — Z9189 Other specified personal risk factors, not elsewhere classified: Secondary | ICD-10-CM

## 2019-07-18 DIAGNOSIS — E782 Mixed hyperlipidemia: Secondary | ICD-10-CM

## 2019-07-18 DIAGNOSIS — E291 Testicular hypofunction: Secondary | ICD-10-CM

## 2019-07-18 MED ORDER — ROSUVASTATIN CALCIUM 20 MG PO TABS: 20.0000 mg | ORAL_TABLET | Freq: Every day | ORAL | 0 refills | Status: DC

## 2019-07-18 MED ORDER — SILDENAFIL CITRATE 20 MG PO TABS: 40.0000 mg | ORAL_TABLET | Freq: Every day | ORAL | 0 refills | Status: DC | PRN

## 2019-07-18 NOTE — Patient Instructions (Addendum)
Please call [facility]- [phone] to discuss your home sleep test.   Call your insurance provider and find out which topical testosterone replacement is covered. Androgel or Testim are the two we use most.   I will be in touch with your lab results.   Sildenafil tablets (Erectile Dysfunction) What is this medicine? SILDENAFIL (sil DEN a fil) is used to treat erection problems in men. This medicine may be used for other purposes; ask your health care provider or pharmacist if you have questions. COMMON BRAND NAME(S): Viagra What should I tell my health care provider before I take this medicine? They need to know if you have any of these conditions:  bleeding disorders  eye or vision problems, including a rare inherited eye disease called retinitis pigmentosa  anatomical deformation of the penis, Peyronie's disease, or history of priapism (painful and prolonged erection)  heart disease, angina, a history of heart attack, irregular heart beats, or other heart problems  high or low blood pressure  history of blood diseases, like sickle cell anemia or leukemia  history of stomach bleeding  kidney disease  liver disease  stroke  an unusual or allergic reaction to sildenafil, other medicines, foods, dyes, or preservatives  pregnant or trying to get pregnant  breast-feeding How should I use this medicine? Take this medicine by mouth with a glass of water. Follow the directions on the prescription label. The dose is usually taken 1 hour before sexual activity. You should not take the dose more than once per day. Do not take your medicine more often than directed. Talk to your pediatrician regarding the use of this medicine in children. This medicine is not used in children for this condition. Overdosage: If you think you have taken too much of this medicine contact a poison control center or emergency room at once. NOTE: This medicine is only for you. Do not share  this medicine with others. What if I miss a dose? This does not apply. Do not take double or extra doses. What may interact with this medicine? Do not take this medicine with any of the following medications:  cisapride  nitrates like amyl nitrite, isosorbide dinitrate, isosorbide mononitrate, nitroglycerin  riociguat This medicine may also interact with the following medications:  antiviral medicines for HIV or AIDS  bosentan  certain medicines for benign prostatic hyperplasia (BPH)  certain medicines for blood pressure  certain medicines for fungal infections like ketoconazole and itraconazole  cimetidine  erythromycin  rifampin This list may not describe all possible interactions. Give your health care provider a list of all the medicines, herbs, non-prescription drugs, or dietary supplements you use. Also tell them if you smoke, drink alcohol, or use illegal drugs. Some items may interact with your medicine. What should I watch for while using this medicine? If you notice any changes in your vision while taking this drug, call your doctor or health care professional as soon as possible. Stop using this medicine and call your health care provider right away if you have a loss of sight in one or both eyes. Contact your doctor or health care professional right away if you have an erection that lasts longer than 4 hours or if it becomes painful. This may be a sign of a serious problem and must be treated right away to prevent permanent damage. If you experience symptoms of nausea, dizziness, chest pain or arm pain upon initiation of sexual activity after taking this medicine, you should refrain from further  activity and call your doctor or health care professional as soon as possible. Do not drink alcohol to excess (examples, 5 glasses of wine or 5 shots of whiskey) when taking this medicine. When taken in excess, alcohol can increase your chances of getting a headache or getting  dizzy, increasing your heart rate or lowering your blood pressure. Using this medicine does not protect you or your partner against HIV infection (the virus that causes AIDS) or other sexually transmitted diseases. What side effects may I notice from receiving this medicine? Side effects that you should report to your doctor or health care professional as soon as possible:  allergic reactions like skin rash, itching or hives, swelling of the face, lips, or tongue  breathing problems  changes in hearing  changes in vision  chest pain  fast, irregular heartbeat  prolonged or painful erection  seizures Side effects that usually do not require medical attention (report to your doctor or health care professional if they continue or are bothersome):  back pain  dizziness  flushing  headache  indigestion  muscle aches  nausea  stuffy or runny nose This list may not describe all possible side effects. Call your doctor for medical advice about side effects. You may report side effects to FDA at 1-800-FDA-1088. Where should I keep my medicine? Keep out of reach of children. Store at room temperature between 15 and 30 degrees C (59 and 86 degrees F). Throw away any unused medicine after the expiration date. NOTE: This sheet is a summary. It may not cover all possible information. If you have questions about this medicine, talk to your doctor, pharmacist, or health care provider.  2020 Elsevier/Gold Standard (2014-12-18 12:00:25)

## 2019-07-19 ENCOUNTER — Encounter: Payer: Self-pay | Admitting: Family Medicine

## 2019-07-19 LAB — PROLACTIN: Prolactin: 6.4 ng/mL (ref 4.0–15.2)

## 2019-07-19 LAB — PSA: Prostate Specific Ag, Serum: 0.6 ng/mL (ref 0.0–4.0)

## 2019-07-19 LAB — FSH/LH
FSH: 2.4 m[IU]/mL (ref 1.5–12.4)
LH: 3.9 m[IU]/mL (ref 1.7–8.6)

## 2019-07-24 ENCOUNTER — Other Ambulatory Visit: Payer: Self-pay | Admitting: Family Medicine

## 2019-07-24 DIAGNOSIS — J4540 Moderate persistent asthma, uncomplicated: Secondary | ICD-10-CM

## 2019-07-24 DIAGNOSIS — E291 Testicular hypofunction: Secondary | ICD-10-CM

## 2019-07-24 MED ORDER — TESTOSTERONE 20.25 MG/ACT (1.62%) TD GEL: 2.0000 | Freq: Every morning | TRANSDERMAL | 1 refills | Status: DC

## 2019-07-27 ENCOUNTER — Other Ambulatory Visit: Payer: Self-pay | Admitting: Family Medicine

## 2019-07-27 DIAGNOSIS — N529 Male erectile dysfunction, unspecified: Secondary | ICD-10-CM

## 2019-07-27 NOTE — Telephone Encounter (Signed)
Please find out if he is requesting this refill already. How much is he taking it and does it work? Any side effects?

## 2019-07-27 NOTE — Telephone Encounter (Signed)
Left message for pt to call me back 

## 2019-07-30 ENCOUNTER — Other Ambulatory Visit: Payer: Self-pay | Admitting: Family Medicine

## 2019-07-30 DIAGNOSIS — N529 Male erectile dysfunction, unspecified: Secondary | ICD-10-CM

## 2019-07-30 NOTE — Telephone Encounter (Signed)
Pt states that pt does not need a refill on this. He has not been able to take any pills. His insuance will not pay for this.   Adam- have you gotten a prior auth for this medicine yet

## 2019-08-07 ENCOUNTER — Telehealth: Payer: Self-pay | Admitting: Family Medicine

## 2019-08-13 ENCOUNTER — Other Ambulatory Visit: Payer: Self-pay | Admitting: Medical

## 2019-08-13 ENCOUNTER — Other Ambulatory Visit: Payer: Self-pay | Admitting: Family Medicine

## 2019-08-13 DIAGNOSIS — J4540 Moderate persistent asthma, uncomplicated: Secondary | ICD-10-CM

## 2019-08-13 DIAGNOSIS — N529 Male erectile dysfunction, unspecified: Secondary | ICD-10-CM

## 2019-08-13 DIAGNOSIS — E291 Testicular hypofunction: Secondary | ICD-10-CM

## 2019-08-13 MED ORDER — TESTOSTERONE 20.25 MG/ACT (1.62%) TD GEL: 2.0000 | Freq: Every morning | TRANSDERMAL | 0 refills | Status: DC

## 2019-08-13 MED ORDER — SILDENAFIL CITRATE 20 MG PO TABS: 40.0000 mg | ORAL_TABLET | Freq: Every day | ORAL | 0 refills | Status: DC | PRN

## 2019-08-13 NOTE — Telephone Encounter (Signed)
Is this okay to refill? 

## 2019-08-14 ENCOUNTER — Telehealth: Payer: Self-pay | Admitting: Family Medicine

## 2019-08-14 NOTE — Telephone Encounter (Signed)
P.A. denied unable to approved for this dx, only for PAH.  Called pharmacy & with discount card is $21 for #30.  Pt informed

## 2019-08-14 NOTE — Telephone Encounter (Signed)
P.A. TESTOSTERONE done, approved, went thru for $10, pt informed

## 2019-08-21 ENCOUNTER — Other Ambulatory Visit: Payer: Self-pay | Admitting: Family Medicine

## 2019-08-21 DIAGNOSIS — N529 Male erectile dysfunction, unspecified: Secondary | ICD-10-CM

## 2019-08-21 NOTE — Telephone Encounter (Signed)
[facility] is requesting to fill pt sildenafil. Please advise K[facility] ?

## 2019-09-03 ENCOUNTER — Ambulatory Visit: Payer: Managed Care, Other (non HMO) | Admitting: Family Medicine

## 2019-09-03 ENCOUNTER — Encounter: Payer: Self-pay | Admitting: Family Medicine

## 2019-09-03 VITALS — BP 120/82 | HR 65 | Wt 239.6 lb

## 2019-09-03 DIAGNOSIS — K760 Fatty (change of) liver, not elsewhere classified: Secondary | ICD-10-CM | POA: Diagnosis not present

## 2019-09-03 DIAGNOSIS — Z79899 Other long term (current) drug therapy: Secondary | ICD-10-CM

## 2019-09-03 DIAGNOSIS — E291 Testicular hypofunction: Secondary | ICD-10-CM | POA: Diagnosis not present

## 2019-09-03 DIAGNOSIS — R7989 Other specified abnormal findings of blood chemistry: Secondary | ICD-10-CM | POA: Diagnosis not present

## 2019-09-03 NOTE — Patient Instructions (Addendum)
I will be in touch with your lab results.  

## 2019-09-03 NOTE — Progress Notes (Signed)
   Subjective:    Patient ID: David [facility], male    DOB: October 27, 1975, 44 y.o.   MRN: [id number]  HPI Chief Complaint  Patient presents with  . follow-up    follow-up on testosterone   Here for follow up on hypogonadism.  Reports using testosterone daily. States he has improved energy level and libido. States overall he feels better.   States he has appt tomorrow for sleep apnea testing   States he has mild jaw pain bilaterally without any explanation.   We are monitoring his LFTs thought to be related to fatty liver disease.   Denies fever, chills, dizziness, chest pain, palpitations, shortness of breath, abdominal pain, N/V/D, urinary symptoms, LE edema.   Reviewed allergies, medications, past medical, surgical, family, and social history.   Review of Systems Pertinent positives and negatives in the history of present illness.     Objective:   Physical Exam Constitutional:      Appearance: Normal appearance.  HENT:     Head:     Jaw: No tenderness or pain on movement.     Salivary Glands: Right salivary gland is not diffusely enlarged or tender. Left salivary gland is not diffusely enlarged or tender.     Right Ear: Tympanic membrane and ear canal normal. No mastoid tenderness.     Left Ear: Tympanic membrane and ear canal normal. No mastoid tenderness.  Eyes:     Conjunctiva/sclera: Conjunctivae normal.  Lymphadenopathy:     Head:     Right side of head: No tonsillar, preauricular or posterior auricular adenopathy.     Left side of head: No tonsillar adenopathy.     Cervical: No cervical adenopathy.  Neurological:     Mental Status: He is alert.    BP 120/82   Pulse 65   Wt 239 lb 9.6 oz (108.7 kg)   BMI 33.42 kg/m        Assessment & Plan:  Hypogonadism in male - Plan: Testosterone -reports improved symptoms with testosterone replacement. Follow up pending results and adjust therapy as needed. Discussed that his symptoms will be important in deciding dose  and length of treatment. He is aware of potential side effects and that we will treat him as long as benefits outweigh risks.   Elevated LFTs - Plan: Comprehensive metabolic panel -keep an ey on liver function especially now that he is on testosterone replacement.   Medication management - Plan: CBC with Differential/Platelet, Comprehensive metabolic panel, Testosterone  Fatty liver - Plan: Comprehensive metabolic panel -continue with low fat diet and weight loss.   Recommend sleep study tomorrow as scheduled.

## 2019-09-04 ENCOUNTER — Ambulatory Visit (HOSPITAL_BASED_OUTPATIENT_CLINIC_OR_DEPARTMENT_OTHER): Payer: Managed Care, Other (non HMO) | Attending: Family Medicine | Admitting: Cardiology

## 2019-09-04 ENCOUNTER — Other Ambulatory Visit: Payer: Self-pay | Admitting: Internal Medicine

## 2019-09-04 ENCOUNTER — Other Ambulatory Visit: Payer: Self-pay

## 2019-09-04 DIAGNOSIS — R0683 Snoring: Secondary | ICD-10-CM

## 2019-09-04 DIAGNOSIS — R635 Abnormal weight gain: Secondary | ICD-10-CM | POA: Diagnosis not present

## 2019-09-04 DIAGNOSIS — E291 Testicular hypofunction: Secondary | ICD-10-CM

## 2019-09-04 DIAGNOSIS — G4733 Obstructive sleep apnea (adult) (pediatric): Secondary | ICD-10-CM | POA: Diagnosis present

## 2019-09-04 DIAGNOSIS — R0902 Hypoxemia: Secondary | ICD-10-CM | POA: Diagnosis not present

## 2019-09-04 DIAGNOSIS — Z79899 Other long term (current) drug therapy: Secondary | ICD-10-CM

## 2019-09-04 DIAGNOSIS — R5383 Other fatigue: Secondary | ICD-10-CM

## 2019-09-04 DIAGNOSIS — Z9189 Other specified personal risk factors, not elsewhere classified: Secondary | ICD-10-CM

## 2019-09-04 LAB — TESTOSTERONE: Testosterone: 275 ng/dL (ref 264–916)

## 2019-09-04 LAB — CBC WITH DIFFERENTIAL/PLATELET
Basophils Absolute: 0 10*3/uL (ref 0.0–0.2)
Basos: 1 %
EOS (ABSOLUTE): 0.1 10*3/uL (ref 0.0–0.4)
Eos: 1 %
Hematocrit: 50.4 % (ref 37.5–51.0)
Hemoglobin: 17.1 g/dL (ref 13.0–17.7)
Immature Grans (Abs): 0 10*3/uL (ref 0.0–0.1)
Immature Granulocytes: 0 %
Lymphocytes Absolute: 1.7 10*3/uL (ref 0.7–3.1)
Lymphs: 27 %
MCH: 32.5 pg (ref 26.6–33.0)
MCHC: 33.9 g/dL (ref 31.5–35.7)
MCV: 96 fL (ref 79–97)
Monocytes Absolute: 0.4 10*3/uL (ref 0.1–0.9)
Monocytes: 7 %
Neutrophils Absolute: 4.1 10*3/uL (ref 1.4–7.0)
Neutrophils: 64 %
Platelets: 302 10*3/uL (ref 150–450)
RBC: 5.26 x10E6/uL (ref 4.14–5.80)
RDW: 13.2 % (ref 11.6–15.4)
WBC: 6.4 10*3/uL (ref 3.4–10.8)

## 2019-09-04 LAB — COMPREHENSIVE METABOLIC PANEL
ALT: 56 IU/L — ABNORMAL HIGH (ref 0–44)
AST: 41 IU/L — ABNORMAL HIGH (ref 0–40)
Albumin/Globulin Ratio: 2.2 (ref 1.2–2.2)
Albumin: 4.8 g/dL (ref 4.0–5.0)
Alkaline Phosphatase: 80 IU/L (ref 48–121)
BUN/Creatinine Ratio: 11 (ref 9–20)
BUN: 12 mg/dL (ref 6–24)
Bilirubin Total: 1.1 mg/dL (ref 0.0–1.2)
CO2: 24 mmol/L (ref 20–29)
Calcium: 9.7 mg/dL (ref 8.7–10.2)
Chloride: 102 mmol/L (ref 96–106)
Creatinine, Ser: 1.12 mg/dL (ref 0.76–1.27)
GFR calc Af Amer: 93 mL/min/{1.73_m2} (ref >59)
GFR calc non Af Amer: 80 mL/min/{1.73_m2} (ref >59)
Globulin, Total: 2.2 g/dL (ref 1.5–4.5)
Glucose: 80 mg/dL (ref 65–99)
Potassium: 5 mmol/L (ref 3.5–5.2)
Sodium: 142 mmol/L (ref 134–144)
Total Protein: 7 g/dL (ref 6.0–8.5)

## 2019-09-04 NOTE — Progress Notes (Signed)
Needs lab visit in 4 weeks for testosterone level and CMP for medication management and hypogonadism. This should be between 8 am and 10 am. Please put in order and schedule. Thanks.

## 2019-09-06 NOTE — Procedures (Signed)
    Patient Name: David [facility], David [facility] Date: 09/04/2019 Gender: Male D.O.B: November 29, 1975 Age (years): 66 Referring Provider: Avanell Shackleton NP Height (inches): 71 Interpreting Physician: Armanda Magic MD, ABSM Weight (lbs): 235 RPSGT: [place] S[place] BMI: 33 MRN: [id number] Neck Size: 17.00  CLINICAL INFORMATION Sleep Study Type: HST  Indication for sleep study: N/A  Epworth Sleepiness Score: 7  SLEEP STUDY TECHNIQUE A multi-channel overnight portable sleep study was performed. The channels recorded were: nasal airflow, thoracic respiratory movement, and oxygen saturation with a pulse oximetry. Snoring was also monitored.  MEDICATIONS Patient self administered medications include: N/A.  SLEEP ARCHITECTURE Patient was studied for 439.7 minutes. The sleep efficiency was 100.0 % and the patient was supine for 64.8%. The arousal index was 0.0 per hour.  RESPIRATORY PARAMETERS The overall AHI was 70.3 per hour, with a central apnea index of 0.0 per hour.  The oxygen nadir was 82% during sleep.  CARDIAC DATA Mean heart rate during sleep was 72.5 bpm.  IMPRESSIONS - Severe obstructive sleep apnea occurred during this study (AHI = 70.3/h). - No significant central sleep apnea occurred during this study (CAI = 0.0/h). - Moderate oxygen desaturation was noted during this study (Min O2 = 82%). - Patient snored 30.2% during the sleep.  DIAGNOSIS - Obstructive Sleep Apnea (G47.33) - Nocturnal Hypoxemia (G47.36)  RECOMMENDATIONS - Recommend CPAP titration in lab due to severity of OSA and nocturnal hypoxemia. - Positional therapy avoiding supine position during sleep. - Avoid alcohol, sedatives and other CNS depressants that may worsen sleep apnea and disrupt normal sleep architecture. - Sleep hygiene should be reviewed to assess factors that may improve sleep quality. - Weight management and regular exercise should be initiated or continued.  [Electronically signed]  09/06/2019 06:54 PM  Armanda Magic MD, ABSM Diplomate, American Board of Sleep Medicine

## 2019-09-10 ENCOUNTER — Telehealth: Payer: Self-pay

## 2019-09-10 ENCOUNTER — Other Ambulatory Visit: Payer: Self-pay | Admitting: Family Medicine

## 2019-09-10 DIAGNOSIS — G4733 Obstructive sleep apnea (adult) (pediatric): Secondary | ICD-10-CM

## 2019-09-10 DIAGNOSIS — Z9189 Other specified personal risk factors, not elsewhere classified: Secondary | ICD-10-CM

## 2019-09-10 NOTE — Telephone Encounter (Signed)
Pt is aware and agreeable to study results and recommendations  Order is in and sent to precert.

## 2019-09-10 NOTE — Telephone Encounter (Signed)
-----   Message from Quintella Reichert, MD sent at 09/06/2019  6:56 PM EDT ----- Please let patient know that they have sleep apnea and recommend CPAP titration. Please set up titration in the sleep lab.

## 2019-09-11 ENCOUNTER — Telehealth: Payer: Self-pay | Admitting: *Deleted

## 2019-09-11 NOTE — Telephone Encounter (Signed)
PA request for CPAP titration submitted to [facility] via fax @ [phone].

## 2019-09-13 ENCOUNTER — Encounter: Payer: Managed Care, Other (non HMO) | Admitting: Family Medicine

## 2019-09-17 ENCOUNTER — Ambulatory Visit: Payer: Managed Care, Other (non HMO) | Admitting: Family Medicine

## 2019-09-27 ENCOUNTER — Other Ambulatory Visit: Payer: Self-pay

## 2019-09-27 DIAGNOSIS — E291 Testicular hypofunction: Secondary | ICD-10-CM

## 2019-09-27 MED ORDER — TESTOSTERONE 20.25 MG/ACT (1.62%) TD GEL: 2.0000 | Freq: Every morning | TRANSDERMAL | 0 refills | Status: DC

## 2019-09-27 NOTE — Telephone Encounter (Signed)
Is this okay to refill? 

## 2019-09-27 NOTE — Telephone Encounter (Signed)
It looks like he needs to come in for labs but do not let him run out please.

## 2019-10-02 ENCOUNTER — Other Ambulatory Visit: Payer: Managed Care, Other (non HMO)

## 2019-10-02 ENCOUNTER — Other Ambulatory Visit: Payer: Self-pay

## 2019-10-02 DIAGNOSIS — E291 Testicular hypofunction: Secondary | ICD-10-CM

## 2019-10-02 DIAGNOSIS — Z79899 Other long term (current) drug therapy: Secondary | ICD-10-CM

## 2019-10-03 LAB — COMPREHENSIVE METABOLIC PANEL
ALT: 64 IU/L — ABNORMAL HIGH (ref 0–44)
AST: 44 IU/L — ABNORMAL HIGH (ref 0–40)
Albumin/Globulin Ratio: 1.9 (ref 1.2–2.2)
Albumin: 4.8 g/dL (ref 4.0–5.0)
Alkaline Phosphatase: 78 IU/L (ref 44–121)
BUN/Creatinine Ratio: 12 (ref 9–20)
BUN: 15 mg/dL (ref 6–24)
Bilirubin Total: 1.1 mg/dL (ref 0.0–1.2)
CO2: 24 mmol/L (ref 20–29)
Calcium: 10 mg/dL (ref 8.7–10.2)
Chloride: 100 mmol/L (ref 96–106)
Creatinine, Ser: 1.25 mg/dL (ref 0.76–1.27)
GFR calc Af Amer: 81 mL/min/{1.73_m2} (ref >59)
GFR calc non Af Amer: 70 mL/min/{1.73_m2} (ref >59)
Globulin, Total: 2.5 g/dL (ref 1.5–4.5)
Glucose: 106 mg/dL — ABNORMAL HIGH (ref 65–99)
Potassium: 4.8 mmol/L (ref 3.5–5.2)
Sodium: 139 mmol/L (ref 134–144)
Total Protein: 7.3 g/dL (ref 6.0–8.5)

## 2019-10-03 LAB — TESTOSTERONE: Testosterone: 290 ng/dL (ref 264–916)

## 2019-10-03 NOTE — Progress Notes (Signed)
Please let him know that I recommend that he follow up with GI since his liver tests are still elevated and now he is taking testosterone. Let's get their input and see if they want to do any additional testing.

## 2019-10-05 ENCOUNTER — Encounter: Payer: Self-pay | Admitting: Internal Medicine

## 2019-10-05 ENCOUNTER — Encounter: Payer: Self-pay | Admitting: Family Medicine

## 2019-10-11 ENCOUNTER — Telehealth: Payer: Self-pay | Admitting: *Deleted

## 2019-10-11 DIAGNOSIS — G4733 Obstructive sleep apnea (adult) (pediatric): Secondary | ICD-10-CM

## 2019-10-11 NOTE — Telephone Encounter (Signed)
Faxed [employer] PA letter requesting additional information or change the order to APAP due to patient not having any co morbidities to [name] for Dr Mayford Knife to review.  Clinic notes and HST report were submitted with original PA request.

## 2019-10-15 ENCOUNTER — Other Ambulatory Visit: Payer: Self-pay | Admitting: Family Medicine

## 2019-10-15 DIAGNOSIS — E782 Mixed hyperlipidemia: Secondary | ICD-10-CM

## 2019-10-15 NOTE — Addendum Note (Signed)
Addended by: Reesa Chew on: 10/15/2019 05:14 PM   Modules accepted: Orders

## 2019-10-15 NOTE — Telephone Encounter (Signed)
[employer] denied in lab study can due a unattended HOME AUTO PAP.

## 2019-10-15 NOTE — Telephone Encounter (Signed)
Please order ResMed CPAP on auto from 4 to 20cm H2O with heated humidity and mask of choice.  Followup with me in 8 weeks and get a download in 2 weeks

## 2019-10-15 NOTE — Telephone Encounter (Signed)
Order placed to [facility] via community message. 

## 2019-10-29 ENCOUNTER — Other Ambulatory Visit: Payer: Self-pay | Admitting: Family Medicine

## 2019-10-29 DIAGNOSIS — E291 Testicular hypofunction: Secondary | ICD-10-CM

## 2019-10-29 MED ORDER — TESTOSTERONE 20.25 MG/ACT (1.62%) TD GEL: 2.0000 | Freq: Every morning | TRANSDERMAL | 0 refills | Status: DC

## 2019-10-29 NOTE — Telephone Encounter (Signed)
Is this okay to refill? 

## 2019-11-20 ENCOUNTER — Other Ambulatory Visit: Payer: Self-pay | Admitting: Family Medicine

## 2019-11-20 NOTE — Telephone Encounter (Signed)
Has cpe on 11/11

## 2019-11-22 ENCOUNTER — Encounter: Payer: Managed Care, Other (non HMO) | Admitting: Family Medicine

## 2019-11-29 ENCOUNTER — Encounter: Payer: Managed Care, Other (non HMO) | Admitting: Family Medicine

## 2019-11-30 ENCOUNTER — Other Ambulatory Visit: Payer: Self-pay

## 2019-11-30 ENCOUNTER — Encounter: Payer: Self-pay | Admitting: Family Medicine

## 2019-11-30 ENCOUNTER — Ambulatory Visit: Payer: Managed Care, Other (non HMO) | Admitting: Family Medicine

## 2019-11-30 VITALS — BP 120/72 | HR 60 | Ht 71.0 in | Wt 242.0 lb

## 2019-11-30 DIAGNOSIS — K760 Fatty (change of) liver, not elsewhere classified: Secondary | ICD-10-CM

## 2019-11-30 DIAGNOSIS — J4530 Mild persistent asthma, uncomplicated: Secondary | ICD-10-CM | POA: Diagnosis not present

## 2019-11-30 DIAGNOSIS — Z0000 Encounter for general adult medical examination without abnormal findings: Secondary | ICD-10-CM

## 2019-11-30 DIAGNOSIS — G4733 Obstructive sleep apnea (adult) (pediatric): Secondary | ICD-10-CM

## 2019-11-30 DIAGNOSIS — E291 Testicular hypofunction: Secondary | ICD-10-CM

## 2019-11-30 DIAGNOSIS — Z23 Encounter for immunization: Secondary | ICD-10-CM | POA: Diagnosis not present

## 2019-11-30 DIAGNOSIS — R7989 Other specified abnormal findings of blood chemistry: Secondary | ICD-10-CM | POA: Diagnosis not present

## 2019-11-30 DIAGNOSIS — Z79899 Other long term (current) drug therapy: Secondary | ICD-10-CM

## 2019-11-30 DIAGNOSIS — E782 Mixed hyperlipidemia: Secondary | ICD-10-CM

## 2019-11-30 DIAGNOSIS — E669 Obesity, unspecified: Secondary | ICD-10-CM

## 2019-11-30 NOTE — Patient Instructions (Signed)
Preventive Care 41-43 Years Old, Male Preventive care refers to lifestyle choices and visits with your health care provider that can promote health and wellness. This includes:  A yearly physical exam. This is also called an annual well check.  Regular dental and eye exams.  Immunizations.  Screening for certain conditions.  Healthy lifestyle choices, such as eating a healthy diet, getting regular exercise, not using drugs or products that contain nicotine and tobacco, and limiting alcohol use. What can I expect for my preventive care visit? Physical exam Your health care provider will check:  Height and weight. These may be used to calculate body mass index (BMI), which is a measurement that tells if you are at a healthy weight.  Heart rate and blood pressure.  Your skin for abnormal spots. Counseling Your health care provider may ask you questions about:  Alcohol, tobacco, and drug use.  Emotional well-being.  Home and relationship well-being.  Sexual activity.  Eating habits.  Work and work Statistician. What immunizations do I need?  Influenza (flu) vaccine  This is recommended every year. Tetanus, diphtheria, and pertussis (Tdap) vaccine  You may need a Td booster every 10 years. Varicella (chickenpox) vaccine  You may need this vaccine if you have not already been vaccinated. Zoster (shingles) vaccine  You may need this after age 64. Measles, mumps, and rubella (MMR) vaccine  You may need at least one dose of MMR if you were born in 1957 or later. You may also need a second dose. Pneumococcal conjugate (PCV13) vaccine  You may need this if you have certain conditions and were not previously vaccinated. Pneumococcal polysaccharide (PPSV23) vaccine  You may need one or two doses if you smoke cigarettes or if you have certain conditions. Meningococcal conjugate (MenACWY) vaccine  You may need this if you have certain conditions. Hepatitis A  vaccine  You may need this if you have certain conditions or if you travel or work in places where you may be exposed to hepatitis A. Hepatitis B vaccine  You may need this if you have certain conditions or if you travel or work in places where you may be exposed to hepatitis B. Haemophilus influenzae type b (Hib) vaccine  You may need this if you have certain risk factors. Human papillomavirus (HPV) vaccine  If recommended by your health care provider, you may need three doses over 6 months. You may receive vaccines as individual doses or as more than one vaccine together in one shot (combination vaccines). Talk with your health care provider about the risks and benefits of combination vaccines. What tests do I need? Blood tests  Lipid and cholesterol levels. These may be checked every 5 years, or more frequently if you are over 60 years old.  Hepatitis C test.  Hepatitis B test. Screening  Lung cancer screening. You may have this screening every year starting at age 43 if you have a 30-pack-year history of smoking and currently smoke or have quit within the past 15 years.  Prostate cancer screening. Recommendations will vary depending on your family history and other risks.  Colorectal cancer screening. All adults should have this screening starting at age 72 and continuing until age 2. Your health care provider may recommend screening at age 14 if you are at increased risk. You will have tests every 1-10 years, depending on your results and the type of screening test.  Diabetes screening. This is done by checking your blood sugar (glucose) after you have not eaten  for a while (fasting). You may have this done every 1-3 years.  Sexually transmitted disease (STD) testing. Follow these instructions at home: Eating and drinking  Eat a diet that includes fresh fruits and vegetables, whole grains, lean protein, and low-fat dairy products.  Take vitamin and mineral supplements as  recommended by your health care provider.  Do not drink alcohol if your health care provider tells you not to drink.  If you drink alcohol: ? Limit how much you have to 0-2 drinks a day. ? Be aware of how much alcohol is in your drink. In the [place] one drink equals one 12 oz bottle of beer (355 mL), one 5 oz glass of wine (148 mL), or one 1 oz glass of hard liquor (44 mL). Lifestyle  Take daily care of your teeth and gums.  Stay active. Exercise for at least 30 minutes on 5 or more days each week.  Do not use any products that contain nicotine or tobacco, such as cigarettes, e-cigarettes, and chewing tobacco. If you need help quitting, ask your health care provider.  If you are sexually active, practice safe sex. Use a condom or other form of protection to prevent STIs (sexually transmitted infections).  Talk with your health care provider about taking a low-dose aspirin every day starting at age 53. What's next?  Go to your health care provider once a year for a well check visit.  Ask your health care provider how often you should have your eyes and teeth checked.  Stay up to date on all vaccines. This information is not intended to replace advice given to you by your health care provider. Make sure you discuss any questions you have with your health care provider. Document Revised: 12/29/2017 Document Reviewed: 12/29/2017 Elsevier Patient Education  2020 [employer].

## 2019-11-30 NOTE — Progress Notes (Signed)
Subjective:    Patient ID: David Kent, male    DOB: December 09, 1975, 44 y.o.   MRN: [id number]  HPI Chief Complaint  Patient presents with  . fasting cpe    fasting cpe   He is here for a complete physical exam and to follow-up on chronic health conditions.  Other providers:  Dr. Mayford Knife   States he has an appointment on 12/20/2019 for his sleep study with CPAP.  He was diagnosed with severe OSA.  Asthma -reports doing well with his daily maintenance medication and rarely needs albuterol.  Elevated LFTs and fatty liver-states he has practically eliminated alcohol.  He would like to recheck these today.  Obesity-he would like to lose weight.  Hypogonadism-he is doing fine using topical testosterone.  Energy level is consistent but feels that it could be better.   Social history: Lives with family, works in Multimedia programmer estate  Former smoker from age [age], drinks alcohol- occasionally, no drug use Diet: fairly healthy  Exercise: 2 times per week. Running and weight lifting   Immunizations: [employer] booster received at [facility] 2 weeks ago. Flu shot today    Health maintenance:  Colonoscopy: never  Last PSA: June 2021  Last Dental Exam: one year ago  Last Eye Exam: overdue.  Wears seatbelt always, uses sunscreen, smoke detectors in home and functioning, does not text while driving, feels safe in home environment.  Reviewed allergies, medications, past medical, surgical, family, and social history.     Review of Systems Review of Systems Constitutional: -fever, -chills, -sweats, -unexpected weight change,-fatigue ENT: -runny nose, -ear pain, -sore throat Cardiology:  -chest pain, -palpitations, -edema Respiratory: -cough, -shortness of breath, -wheezing Gastroenterology: -abdominal pain, -nausea, -vomiting, -diarrhea, -constipation  Hematology: -bleeding or bruising problems Musculoskeletal: -arthralgias, -myalgias, -joint swelling, -back  pain Ophthalmology: -vision changes Urology: -dysuria, -difficulty urinating, -hematuria, -urinary frequency, -urgency Neurology: -headache, -weakness, -tingling, -numbness        Objective:   Physical Exam BP 120/72   Pulse 60   Ht 5\' 11"  (1.803 m)   Wt 242 lb (109.8 kg)   BMI 33.75 kg/m   General Appearance:    Alert, cooperative, no distress, appears stated age  Head:    Normocephalic, without obvious abnormality, atraumatic  Eyes:    PERRL, conjunctiva/corneas clear, EOM's intact  Ears:    Normal TM's and external ear canals  Nose:  Mask on  Throat:  Mask on  Neck:   Supple, no lymphadenopathy;  thyroid:  no   enlargement/tenderness/nodules; no JVD  Back:    Spine nontender, no curvature, ROM normal, no CVA     tenderness  Lungs:     Clear to auscultation bilaterally without wheezes, rales or     ronchi; respirations unlabored  Chest Wall:    No tenderness or deformity   Heart:    Regular rate and rhythm, S1 and S2 normal, no murmur, rub   or gallop  Breast Exam:    No chest wall tenderness, masses or gynecomastia  Abdomen:     Soft, non-tender, nondistended, normoactive bowel sounds,    no masses, no hepatosplenomegaly  Genitalia:   Not done     Extremities:   No clubbing, cyanosis or edema  Pulses:   2+ and symmetric all extremities  Skin:   Skin color, texture, turgor normal, no rashes or lesions  Lymph nodes:   Cervical, supraclavicular, and axillary nodes normal  Neurologic:   CNII-XII intact, normal strength, sensation and gait  Psych:   Normal mood, affect, hygiene and grooming.         Assessment & Plan:  Routine general medical examination at a health care facility - Plan: CBC with Differential/Platelet, Comprehensive metabolic panel -He is here today for a CPE.  Preventive health care reviewed.  Immunizations reviewed and flu shot given today.  Counseled on healthy lifestyle including diet, exercise and limiting alcohol.  He is in good spirits.   Motivated to take good care of himself.  Discussed safety and health promotion.  Recommend regular self testicular exams.  Also recommend regular dental and eye exams.  Mild persistent asthma without complication -Well-controlled.  Continue Symbicort  Elevated LFTs - Plan: Comprehensive metabolic panel -Avoid NSAIDs and alcohol.  Follow-up pending results  Fatty liver - Plan: Comprehensive metabolic panel  Mixed hyperlipidemia -His cholesterol improved significantly with Crestor.  Continue statin therapy.  Continue low-fat diet, getting at least 150 minutes of physical activity each week.  Hypogonadism in male - Plan: Testosterone -He is seen some benefit from his current testosterone replacement therapy.  Discussed that even a small amount of weight loss may help increase his testosterone level.  I will recheck his testosterone level today in follow-up.  OSA (obstructive sleep apnea) -Severe and is scheduled for sleep study with CPAP.  Discussed benefits for his overall health including energy level once he is using a CPAP.  Obesity (BMI 30.0-34.9) -Recommend healthy, low-carb, low-fat diet and getting at least 150 minutes of physical activity each week.  I expect that he will have more energy to exercise once his treating his OSA  Needs flu shot - Plan: Flu Vaccine QUAD 36+ mos IM  Medication management - Plan: Testosterone

## 2019-12-01 LAB — COMPREHENSIVE METABOLIC PANEL
ALT: 70 IU/L — ABNORMAL HIGH (ref 0–44)
AST: 39 IU/L (ref 0–40)
Albumin/Globulin Ratio: 2 (ref 1.2–2.2)
Albumin: 4.7 g/dL (ref 4.0–5.0)
Alkaline Phosphatase: 81 IU/L (ref 44–121)
BUN/Creatinine Ratio: 15 (ref 9–20)
BUN: 18 mg/dL (ref 6–24)
Bilirubin Total: 1.3 mg/dL — ABNORMAL HIGH (ref 0.0–1.2)
CO2: 25 mmol/L (ref 20–29)
Calcium: 9.9 mg/dL (ref 8.7–10.2)
Chloride: 102 mmol/L (ref 96–106)
Creatinine, Ser: 1.22 mg/dL (ref 0.76–1.27)
GFR calc Af Amer: 83 mL/min/{1.73_m2} (ref >59)
GFR calc non Af Amer: 72 mL/min/{1.73_m2} (ref >59)
Globulin, Total: 2.4 g/dL (ref 1.5–4.5)
Glucose: 89 mg/dL (ref 65–99)
Potassium: 4.7 mmol/L (ref 3.5–5.2)
Sodium: 141 mmol/L (ref 134–144)
Total Protein: 7.1 g/dL (ref 6.0–8.5)

## 2019-12-01 LAB — CBC WITH DIFFERENTIAL/PLATELET
Basophils Absolute: 0.1 10*3/uL (ref 0.0–0.2)
Basos: 1 %
EOS (ABSOLUTE): 0.1 10*3/uL (ref 0.0–0.4)
Eos: 2 %
Hematocrit: 52.1 % — ABNORMAL HIGH (ref 37.5–51.0)
Hemoglobin: 17.9 g/dL — ABNORMAL HIGH (ref 13.0–17.7)
Immature Grans (Abs): 0 10*3/uL (ref 0.0–0.1)
Immature Granulocytes: 0 %
Lymphocytes Absolute: 2 10*3/uL (ref 0.7–3.1)
Lymphs: 30 %
MCH: 32.4 pg (ref 26.6–33.0)
MCHC: 34.4 g/dL (ref 31.5–35.7)
MCV: 94 fL (ref 79–97)
Monocytes Absolute: 0.5 10*3/uL (ref 0.1–0.9)
Monocytes: 8 %
Neutrophils Absolute: 3.9 10*3/uL (ref 1.4–7.0)
Neutrophils: 59 %
Platelets: 315 10*3/uL (ref 150–450)
RBC: 5.52 x10E6/uL (ref 4.14–5.80)
RDW: 13 % (ref 11.6–15.4)
WBC: 6.6 10*3/uL (ref 3.4–10.8)

## 2019-12-01 LAB — TESTOSTERONE: Testosterone: 179 ng/dL — ABNORMAL LOW (ref 264–916)

## 2019-12-05 ENCOUNTER — Other Ambulatory Visit: Payer: Self-pay | Admitting: Internal Medicine

## 2019-12-05 ENCOUNTER — Other Ambulatory Visit: Payer: Self-pay | Admitting: Family Medicine

## 2019-12-05 DIAGNOSIS — E291 Testicular hypofunction: Secondary | ICD-10-CM

## 2019-12-05 MED ORDER — TESTOSTERONE 20.25 MG/ACT (1.62%) TD GEL: 4.0000 | Freq: Every morning | TRANSDERMAL | 0 refills | Status: DC

## 2019-12-05 NOTE — Telephone Encounter (Signed)
Is this okay to refill? 

## 2019-12-05 NOTE — Progress Notes (Signed)
Prescription sent

## 2019-12-06 ENCOUNTER — Telehealth: Payer: Self-pay | Admitting: Internal Medicine

## 2019-12-06 MED ORDER — TESTOSTERONE 20.25 MG/ACT (1.62%) TD GEL: 4.0000 | Freq: Every morning | TRANSDERMAL | 0 refills | Status: DC

## 2019-12-06 NOTE — Telephone Encounter (Signed)
[facility] called and states testosterone for pt is 150g for a month supply at 4 pumps as it comes in a box of 75g

## 2019-12-06 NOTE — Addendum Note (Signed)
Addended by: Avanell Shackleton on: 12/06/2019 10:40 AM   Modules accepted: Orders

## 2019-12-17 ENCOUNTER — Other Ambulatory Visit: Payer: Self-pay | Admitting: Family Medicine

## 2019-12-31 ENCOUNTER — Other Ambulatory Visit: Payer: Self-pay | Admitting: Internal Medicine

## 2019-12-31 DIAGNOSIS — E291 Testicular hypofunction: Secondary | ICD-10-CM

## 2020-01-02 ENCOUNTER — Other Ambulatory Visit: Payer: Managed Care, Other (non HMO)

## 2020-01-03 ENCOUNTER — Other Ambulatory Visit: Payer: Self-pay

## 2020-01-03 ENCOUNTER — Encounter: Payer: Self-pay | Admitting: Family Medicine

## 2020-01-03 ENCOUNTER — Other Ambulatory Visit: Payer: Managed Care, Other (non HMO)

## 2020-01-03 DIAGNOSIS — E291 Testicular hypofunction: Secondary | ICD-10-CM

## 2020-01-04 LAB — TESTOSTERONE: Testosterone: 903 ng/dL (ref 264–916)

## 2020-01-04 NOTE — Progress Notes (Signed)
His testosterone had a significant jump with the increase of the additional 2 pump of testosterone. Please schedule him for a 4 week in office follow up, ideally between 8:15 am and 10 am. Thanks.

## 2020-01-07 ENCOUNTER — Other Ambulatory Visit: Payer: Self-pay | Admitting: Family Medicine

## 2020-01-07 DIAGNOSIS — E291 Testicular hypofunction: Secondary | ICD-10-CM

## 2020-01-07 DIAGNOSIS — N529 Male erectile dysfunction, unspecified: Secondary | ICD-10-CM

## 2020-01-07 MED ORDER — TESTOSTERONE 20.25 MG/ACT (1.62%) TD GEL: 4.0000 | Freq: Every morning | TRANSDERMAL | 0 refills | Status: DC

## 2020-01-07 MED ORDER — SILDENAFIL CITRATE 20 MG PO TABS: 20.0000 mg | ORAL_TABLET | ORAL | 0 refills | Status: DC | PRN

## 2020-01-07 NOTE — Telephone Encounter (Signed)
ok 

## 2020-01-07 NOTE — Telephone Encounter (Signed)
Ok to refill 

## 2020-01-08 ENCOUNTER — Encounter: Payer: Self-pay | Admitting: Family Medicine

## 2020-01-08 DIAGNOSIS — E291 Testicular hypofunction: Secondary | ICD-10-CM

## 2020-01-08 NOTE — Telephone Encounter (Signed)
Called and spoke to pharmacy Pt is suppose to get 2 boxes for a 30 day supply (175G) and they only gave him 1 box by accident so they were going to get the other box ready and I have advised pt to go pick up

## 2020-01-22 ENCOUNTER — Other Ambulatory Visit: Payer: Self-pay | Admitting: Family Medicine

## 2020-01-22 DIAGNOSIS — E782 Mixed hyperlipidemia: Secondary | ICD-10-CM

## 2020-01-24 ENCOUNTER — Telehealth: Payer: Self-pay | Admitting: *Deleted

## 2020-01-24 NOTE — Telephone Encounter (Signed)
Patient has a 10 week follow up appointment scheduled for 02/08/20. Patient understands he needs to keep this appointment for insurance compliance. Left detailed message on voicemail and informed patient to call back with questions

## 2020-02-04 ENCOUNTER — Other Ambulatory Visit: Payer: Self-pay | Admitting: Family Medicine

## 2020-02-04 DIAGNOSIS — N529 Male erectile dysfunction, unspecified: Secondary | ICD-10-CM

## 2020-02-05 NOTE — Telephone Encounter (Signed)
Ok to refill 

## 2020-02-06 ENCOUNTER — Ambulatory Visit: Payer: Managed Care, Other (non HMO) | Admitting: Family Medicine

## 2020-02-08 ENCOUNTER — Other Ambulatory Visit: Payer: Self-pay

## 2020-02-08 ENCOUNTER — Telehealth: Payer: Managed Care, Other (non HMO) | Admitting: Cardiology

## 2020-02-09 ENCOUNTER — Telehealth: Payer: Self-pay

## 2020-02-09 NOTE — Telephone Encounter (Signed)
P.A. SILDENAFIL  

## 2020-02-12 ENCOUNTER — Encounter: Payer: Self-pay | Admitting: Family Medicine

## 2020-02-13 ENCOUNTER — Other Ambulatory Visit: Payer: Self-pay | Admitting: Internal Medicine

## 2020-02-13 DIAGNOSIS — E291 Testicular hypofunction: Secondary | ICD-10-CM

## 2020-02-13 MED ORDER — TESTOSTERONE 20.25 MG/ACT (1.62%) TD GEL: 4.0000 | Freq: Every morning | TRANSDERMAL | 0 refills | Status: DC

## 2020-02-13 NOTE — Telephone Encounter (Signed)
Please send in °

## 2020-02-15 ENCOUNTER — Ambulatory Visit: Payer: Managed Care, Other (non HMO) | Admitting: Family Medicine

## 2020-02-19 ENCOUNTER — Telehealth (INDEPENDENT_AMBULATORY_CARE_PROVIDER_SITE_OTHER): Payer: Managed Care, Other (non HMO) | Admitting: Cardiology

## 2020-02-19 ENCOUNTER — Other Ambulatory Visit: Payer: Self-pay

## 2020-02-19 ENCOUNTER — Encounter: Payer: Self-pay | Admitting: Cardiology

## 2020-02-19 VITALS — Ht 71.0 in | Wt 225.0 lb

## 2020-02-19 DIAGNOSIS — G4733 Obstructive sleep apnea (adult) (pediatric): Secondary | ICD-10-CM

## 2020-02-19 DIAGNOSIS — E669 Obesity, unspecified: Secondary | ICD-10-CM

## 2020-02-19 NOTE — Progress Notes (Signed)
Virtual Visit via Video Note   This visit type was conducted due to national recommendations for restrictions regarding the COVID-19 Pandemic (e.g. social distancing) in an effort to limit this patient's exposure and mitigate transmission in our community.  Due to his co-morbid illnesses, this patient is at least at moderate risk for complications without adequate follow up.  This format is felt to be most appropriate for this patient at this time.  All issues noted in this document were discussed and addressed.  A limited physical exam was performed with this format.  Please refer to the patient's chart for his consent to telehealth for [facility].   Date:  02/19/2020   ID:  David Kent, DOB 1975/09/09, MRN [id number] The patient was identified using 2 identifiers.  Patient Location: Home Provider Location: Home Office  PCP:  David Shackleton, NP-C  Cardiologist:  No primary care provider on file.  Electrophysiologist:  None   Evaluation Performed:  New Patient Evaluation  Chief Complaint:  OSA  History of Present Illness:    David Kent is a 45 y.o. male with a hx of HLD, remote tobacco use and hypogonadism who was referred for sleep study by David Blend, NP.  The patient says that his testosterone levels were very low and his NP  wanted to rule out that it was not sleep related.  He underwent home sleep study showing severe OSA with an AHI of 70/hr and O2 desaturations as low as 82%.  He was started on auto CPAP from 4 to 20cm H2O and his now here for followup.    He is doing well with his CPAP device and thinks that he has gotten used to it.  He tolerates the nasal pillow mask and feels the pressure is adequate.  Since going on CPAP he feels rested in the am and has no significant daytime sleepiness.  He denies any significant mouth or nasal dryness or nasal congestion.  He does not think that he snores.    The patient does not have symptoms concerning for COVID-19 infection  (fever, chills, cough, or new shortness of breath).    Past Medical History:  Diagnosis Date  . Childhood asthma   . Elevated LFTs   . Fatty liver 06/05/2018  . Former smoker   . Hyperlipidemia   . Hypogonadism in male 07/16/2019  . Low serum testosterone level 05/31/2019  . Vitamin D deficiency 05/31/2019   Past Surgical History:  Procedure Laterality Date  . LAPAROSCOPIC APPENDECTOMY N/A 09/01/2017   Procedure: APPENDECTOMY LAPAROSCOPIC;  Surgeon: Manus Rudd, MD;  Location: [facility] OR;  Service: General;  Laterality: N/A;     Current Meds  Medication Sig  . albuterol (VENTOLIN HFA) 108 (90 Base) MCG/ACT inhaler INHALE 2 PUFFS INTO THE LUNGS EVERY 6 HOURS AS NEEDED FOR WHEEZING OR SHORTNESS OF BREATH  . Cholecalciferol (VITAMIN D PO) Take 1 tablet by mouth daily.  Marland Kitchen ketoconazole (NIZORAL) 2 % shampoo   . Multiple Vitamin (MULTIVITAMIN WITH MINERALS) TABS tablet Take 1 tablet by mouth daily.  . rosuvastatin (CRESTOR) 20 MG tablet TAKE 1 TABLET(20 MG) BY MOUTH DAILY  . sildenafil (REVATIO) 20 MG tablet TAKE 1 TABLET BY MOUTH AS NEEDED.  Marland Kitchen SYMBICORT 160-4.5 MCG/ACT inhaler INHALE 2 PUFFS INTO THE LUNGS TWICE DAILY  . Testosterone 20.25 MG/ACT (1.62%) GEL Place 4 Pump onto the skin in the morning.     Allergies:   Patient has no known allergies.   Social History   Tobacco  Use  . Smoking status: Former Smoker    Packs/day: 0.50    Years: 15.00    Pack years: 7.50  . Smokeless tobacco: Never Used  Vaping Use  . Vaping Use: Never used  Substance Use Topics  . Alcohol use: Yes    Comment: most days   . Drug use: Never     Family Hx: The patient's family history includes Cancer in his maternal grandmother; Heart disease (age of onset: 78) in his father; Hyperlipidemia in his father and mother; Lung cancer in his maternal grandfather.  ROS:   Please see the history of present illness.     All other systems reviewed and are negative.   Prior CV studies:   The following  studies were reviewed today:  Home sleep study and PAP compliance download  Labs/Other Tests and Data Reviewed:    EKG:  No ECG reviewed.  Recent Labs: 05/30/2019: TSH 1.670 11/30/2019: ALT 70; BUN 18; Creatinine, Ser 1.22; Hemoglobin 17.9; Platelets 315; Potassium 4.7; Sodium 141   Recent Lipid Panel Lab Results  Component Value Date/Time   CHOL 189 07/09/2019 08:44 AM   TRIG 111 07/09/2019 08:44 AM   HDL 43 07/09/2019 08:44 AM   CHOLHDL 4.4 07/09/2019 08:44 AM   LDLCALC 126 (H) 07/09/2019 08:44 AM    Wt Readings from Last 3 Encounters:  02/19/20 225 lb (102.1 kg)  11/30/19 242 lb (109.8 kg)  09/04/19 235 lb (106.6 kg)     Risk Assessment/Calculations:      Objective:    Vital Signs:  Ht 5\' 11"  (1.803 m)   Wt 225 lb (102.1 kg)   BMI 31.38 kg/m    VITAL SIGNS:  reviewed GEN:  no acute distress EYES:  sclerae anicteric, EOMI - Extraocular Movements Intact RESPIRATORY:  normal respiratory effort, symmetric expansion CARDIOVASCULAR:  no peripheral edema SKIN:  no rash, lesions or ulcers. MUSCULOSKELETAL:  no obvious deformities. NEURO:  alert and oriented x 3, no obvious focal deficit PSYCH:  normal affect  ASSESSMENT & PLAN:    OSA - The pathophysiology of obstructive sleep apnea , it's cardiovascular consequences & modes of treatment including CPAP were discused with the patient in detail & they evidenced understanding.  The patient is tolerating PAP therapy well without any problems. The PAP download was reviewed today and showed an AHI of 22.9/hr on auto CPAP cm H2O with 23% compliance in using more than 4 hours nightly.  The patient has been using and benefiting from PAP use and will continue to benefit from therapy.  -he is not adequately controlled on CPAP auto and I think he needs BiPAP -he just ruptured his quad muscle and had to have surgery last week and it is difficult to move around -I will check with Insurance Co to see if we can get an auto BiPAP with  IPAP max 20cm H2O and EPAP min 5cm h2O with PS 4cm H2O.   -I will get a downoad in 4 weeks after starting BiPAP  2.  Obesity -I have encouraged him to get into a routine exercise program and cut back on carbs and portions.    COVID-19 Education: The signs and symptoms of COVID-19 were discussed with the patient and how to seek care for testing (follow up with PCP or arrange E-visit).  The importance of social distancing was discussed today.  Time:   Today, I have spent 20 minutes with the patient with telehealth technology discussing the above problems.  Medication Adjustments/Labs and Tests Ordered: Current medicines are reviewed at length with the patient today.  Concerns regarding medicines are outlined above.   Tests Ordered: No orders of the defined types were placed in this encounter.   Medication Changes: No orders of the defined types were placed in this encounter.   Follow Up:  6 weeks after  After home auto BiPAP titration  Signed, Armanda Magic, MD  02/19/2020 2:26 PM    [facility]

## 2020-02-20 ENCOUNTER — Telehealth: Payer: Self-pay | Admitting: *Deleted

## 2020-02-20 DIAGNOSIS — G4733 Obstructive sleep apnea (adult) (pediatric): Secondary | ICD-10-CM

## 2020-02-20 NOTE — Telephone Encounter (Signed)
-----   Message from Quintella Reichert, MD sent at 02/19/2020  2:29 PM EST ----- Patient has failed CPAP so please order an auto BiPAP with IPAP max 20cm H2O and EPAP min 5cm h2O with PS 4cm H2O.  Get a download in 4 weeks and followup with me in 6 weeks.

## 2020-02-20 NOTE — Telephone Encounter (Signed)
Order placed to [facility] to order an auto BiPAP with IPAP max 20cm H2O and EPAP min 5cm h2O with PS 4cm H2O.   DME selection is [facility]. Patient understands he will be contacted by [facility] to set up his cpap. Patient understands to call if [facility] does not contact him with new setup in a timely manner. Patient understands they will be called once confirmation has been received from [facility] that they have received their new machine to schedule 10 week follow up appointment.  [facility] notified of new cpap order  Please add to airview Patient was grateful for the call and thanked me.

## 2020-02-21 NOTE — Telephone Encounter (Signed)
P.A. denied, called pharmacy with [facility] went thru for $21, pt informed

## 2020-02-22 ENCOUNTER — Telehealth: Payer: Self-pay | Admitting: *Deleted

## 2020-02-22 DIAGNOSIS — G4733 Obstructive sleep apnea (adult) (pediatric): Secondary | ICD-10-CM

## 2020-02-22 NOTE — Telephone Encounter (Signed)
-----   Message from Traci R Turner, MD sent at 02/19/2020  2:29 PM EST ----- Patient has failed CPAP so please order an auto BiPAP with IPAP max 20cm H2O and EPAP min 5cm h2O with PS 4cm H2O.  Get a download in 4 weeks and followup with me in 6 weeks.    

## 2020-02-22 NOTE — Telephone Encounter (Signed)
Order placed to [facility] order an auto BiPAP with IPAP max 20cm H2O and EPAP min 5cm h2O with PS 4cm H2O. Get a download in 4 weeks and followup with me in 6 weeks.

## 2020-02-24 ENCOUNTER — Other Ambulatory Visit: Payer: Self-pay | Admitting: Family Medicine

## 2020-02-26 NOTE — Addendum Note (Signed)
Addended by: Reesa Chew on: 02/26/2020 06:12 PM   Modules accepted: Level of Service, SmartSet

## 2020-02-26 NOTE — Telephone Encounter (Signed)
This encounter was created in error - please disregard.

## 2020-03-20 ENCOUNTER — Other Ambulatory Visit: Payer: Self-pay | Admitting: Family Medicine

## 2020-03-20 DIAGNOSIS — N529 Male erectile dysfunction, unspecified: Secondary | ICD-10-CM

## 2020-03-20 NOTE — Telephone Encounter (Signed)
Pt. Requesting refill on sildenafil pt. Last apt was 12/09/19 and next apt is 05/29/20.

## 2020-03-24 ENCOUNTER — Other Ambulatory Visit: Payer: Self-pay | Admitting: Family Medicine

## 2020-03-24 NOTE — Telephone Encounter (Signed)
[facility] is requesting to fill pt symbicort. Please advise [facility] 

## 2020-03-24 NOTE — Telephone Encounter (Signed)
Ok to schedule  [employer]Core valid dates are [id number] aut # 03/21/20- 09/17/20.

## 2020-03-26 ENCOUNTER — Other Ambulatory Visit: Payer: Self-pay | Admitting: Family Medicine

## 2020-03-26 DIAGNOSIS — E782 Mixed hyperlipidemia: Secondary | ICD-10-CM

## 2020-03-26 DIAGNOSIS — J4540 Moderate persistent asthma, uncomplicated: Secondary | ICD-10-CM

## 2020-03-26 DIAGNOSIS — E291 Testicular hypofunction: Secondary | ICD-10-CM

## 2020-03-27 MED ORDER — ALBUTEROL SULFATE HFA 108 (90 BASE) MCG/ACT IN AERS: 2.0000 | INHALATION_SPRAY | Freq: Four times a day (QID) | RESPIRATORY_TRACT | 0 refills | Status: DC | PRN

## 2020-03-27 MED ORDER — ROSUVASTATIN CALCIUM 20 MG PO TABS: 20.0000 mg | ORAL_TABLET | Freq: Every day | ORAL | 0 refills | Status: DC

## 2020-03-27 MED ORDER — TESTOSTERONE 20.25 MG/ACT (1.62%) TD GEL: 4.0000 | Freq: Every morning | TRANSDERMAL | 0 refills | Status: DC

## 2020-03-27 NOTE — Telephone Encounter (Signed)
[facility] is requesting to fill pt albuterol and testosterone. Please advise [facility]

## 2020-03-27 NOTE — Telephone Encounter (Signed)
Ok to refill albuterol and Crestor. He needs a lab visit for testosterone check.

## 2020-03-27 NOTE — Telephone Encounter (Signed)
How soon does he need labs. Pt has appt in may. [facility]

## 2020-03-30 ENCOUNTER — Other Ambulatory Visit: Payer: Self-pay | Admitting: Family Medicine

## 2020-03-30 DIAGNOSIS — Z79899 Other long term (current) drug therapy: Secondary | ICD-10-CM

## 2020-03-30 DIAGNOSIS — E291 Testicular hypofunction: Secondary | ICD-10-CM

## 2020-03-30 DIAGNOSIS — R7989 Other specified abnormal findings of blood chemistry: Secondary | ICD-10-CM

## 2020-03-30 DIAGNOSIS — K760 Fatty (change of) liver, not elsewhere classified: Secondary | ICD-10-CM

## 2020-03-30 NOTE — Telephone Encounter (Signed)
Needs lab visit for testosterone level and I will put in order. Please schedule him in the next couple of weeks.

## 2020-03-31 NOTE — Telephone Encounter (Signed)
Pt is scheduled for next week but testosterone was printed. I think it should of been sent in. Please resend in

## 2020-04-02 ENCOUNTER — Other Ambulatory Visit: Payer: Self-pay

## 2020-04-02 ENCOUNTER — Other Ambulatory Visit: Payer: Self-pay | Admitting: Family Medicine

## 2020-04-02 ENCOUNTER — Telehealth: Payer: Self-pay | Admitting: Internal Medicine

## 2020-04-02 DIAGNOSIS — E291 Testicular hypofunction: Secondary | ICD-10-CM

## 2020-04-02 MED ORDER — TESTOSTERONE 20.25 MG/ACT (1.62%) TD GEL: 4.0000 | Freq: Every morning | TRANSDERMAL | 0 refills | Status: DC

## 2020-04-02 NOTE — Telephone Encounter (Signed)
Ok

## 2020-04-02 NOTE — Telephone Encounter (Signed)
Please resend in testosterone. It was printed last week instead of being sent electronically

## 2020-04-02 NOTE — Telephone Encounter (Signed)
Left message for pt about med being at pharmacy

## 2020-04-02 NOTE — Telephone Encounter (Deleted)
Patient is scheduled for CPAP Titration on 05/10/20. Patient understands his titration study will be done at [facility] sleep lab. Patient understands he will receive a letter in a week or so detailing appointment, date, time, and location. Patient understands to call if he does not receive the letter  in a timely manner. Patient agrees with treatment and thanked me for call.

## 2020-04-08 ENCOUNTER — Other Ambulatory Visit: Payer: Managed Care, Other (non HMO)

## 2020-04-25 ENCOUNTER — Other Ambulatory Visit: Payer: Self-pay | Admitting: Family Medicine

## 2020-04-25 ENCOUNTER — Telehealth: Payer: Self-pay

## 2020-04-25 DIAGNOSIS — J4540 Moderate persistent asthma, uncomplicated: Secondary | ICD-10-CM

## 2020-04-25 NOTE — Telephone Encounter (Signed)
P.A. ALBUTEROL HFA

## 2020-04-29 ENCOUNTER — Other Ambulatory Visit: Payer: Self-pay | Admitting: Family Medicine

## 2020-04-29 DIAGNOSIS — N529 Male erectile dysfunction, unspecified: Secondary | ICD-10-CM

## 2020-04-29 DIAGNOSIS — E782 Mixed hyperlipidemia: Secondary | ICD-10-CM

## 2020-04-29 NOTE — Telephone Encounter (Signed)
Ok to refill 

## 2020-05-01 ENCOUNTER — Other Ambulatory Visit: Payer: Managed Care, Other (non HMO)

## 2020-05-01 ENCOUNTER — Other Ambulatory Visit: Payer: Self-pay

## 2020-05-01 DIAGNOSIS — K760 Fatty (change of) liver, not elsewhere classified: Secondary | ICD-10-CM

## 2020-05-01 DIAGNOSIS — R7989 Other specified abnormal findings of blood chemistry: Secondary | ICD-10-CM

## 2020-05-01 DIAGNOSIS — Z79899 Other long term (current) drug therapy: Secondary | ICD-10-CM

## 2020-05-01 DIAGNOSIS — E291 Testicular hypofunction: Secondary | ICD-10-CM

## 2020-05-02 LAB — CBC WITH DIFFERENTIAL/PLATELET
Basophils Absolute: 0.1 10*3/uL (ref 0.0–0.2)
Basos: 1 %
EOS (ABSOLUTE): 0.2 10*3/uL (ref 0.0–0.4)
Eos: 3 %
Hematocrit: 51.6 % — ABNORMAL HIGH (ref 37.5–51.0)
Hemoglobin: 17.6 g/dL (ref 13.0–17.7)
Immature Grans (Abs): 0 10*3/uL (ref 0.0–0.1)
Immature Granulocytes: 0 %
Lymphocytes Absolute: 1.7 10*3/uL (ref 0.7–3.1)
Lymphs: 25 %
MCH: 31.4 pg (ref 26.6–33.0)
MCHC: 34.1 g/dL (ref 31.5–35.7)
MCV: 92 fL (ref 79–97)
Monocytes Absolute: 0.5 10*3/uL (ref 0.1–0.9)
Monocytes: 7 %
Neutrophils Absolute: 4.2 10*3/uL (ref 1.4–7.0)
Neutrophils: 64 %
Platelets: 310 10*3/uL (ref 150–450)
RBC: 5.6 x10E6/uL (ref 4.14–5.80)
RDW: 13.1 % (ref 11.6–15.4)
WBC: 6.6 10*3/uL (ref 3.4–10.8)

## 2020-05-02 LAB — COMPREHENSIVE METABOLIC PANEL
ALT: 54 IU/L — ABNORMAL HIGH (ref 0–44)
AST: 33 IU/L (ref 0–40)
Albumin/Globulin Ratio: 2.2 (ref 1.2–2.2)
Albumin: 4.6 g/dL (ref 4.0–5.0)
Alkaline Phosphatase: 75 IU/L (ref 44–121)
BUN/Creatinine Ratio: 13 (ref 9–20)
BUN: 17 mg/dL (ref 6–24)
Bilirubin Total: 1.3 mg/dL — ABNORMAL HIGH (ref 0.0–1.2)
CO2: 22 mmol/L (ref 20–29)
Calcium: 9.8 mg/dL (ref 8.7–10.2)
Chloride: 100 mmol/L (ref 96–106)
Creatinine, Ser: 1.33 mg/dL — ABNORMAL HIGH (ref 0.76–1.27)
Globulin, Total: 2.1 g/dL (ref 1.5–4.5)
Glucose: 95 mg/dL (ref 65–99)
Potassium: 4.8 mmol/L (ref 3.5–5.2)
Sodium: 141 mmol/L (ref 134–144)
Total Protein: 6.7 g/dL (ref 6.0–8.5)
eGFR: 68 mL/min/{1.73_m2} (ref >59)

## 2020-05-02 LAB — TESTOSTERONE: Testosterone: 937 ng/dL — ABNORMAL HIGH (ref 264–916)

## 2020-05-05 NOTE — Telephone Encounter (Signed)
Generic Ventolin is preferred, called pharmacy & pt picked up & went thru for $10

## 2020-05-10 ENCOUNTER — Encounter (HOSPITAL_BASED_OUTPATIENT_CLINIC_OR_DEPARTMENT_OTHER): Payer: Managed Care, Other (non HMO) | Admitting: Cardiology

## 2020-05-11 NOTE — Progress Notes (Signed)
Please call him and make sure he saw my recommendations to cut back his testosterone to 3 pumps.

## 2020-05-22 ENCOUNTER — Other Ambulatory Visit: Payer: Self-pay | Admitting: Family Medicine

## 2020-05-22 DIAGNOSIS — J4540 Moderate persistent asthma, uncomplicated: Secondary | ICD-10-CM

## 2020-05-22 NOTE — Telephone Encounter (Signed)
Ok to refill 

## 2020-05-22 NOTE — Telephone Encounter (Signed)
ok 

## 2020-05-29 ENCOUNTER — Other Ambulatory Visit: Payer: Self-pay | Admitting: Family Medicine

## 2020-05-29 ENCOUNTER — Encounter: Payer: Self-pay | Admitting: Family Medicine

## 2020-05-29 ENCOUNTER — Ambulatory Visit: Payer: Managed Care, Other (non HMO) | Admitting: Family Medicine

## 2020-05-29 ENCOUNTER — Other Ambulatory Visit: Payer: Self-pay

## 2020-05-29 VITALS — BP 124/70 | HR 82 | Wt 227.2 lb

## 2020-05-29 DIAGNOSIS — R7989 Other specified abnormal findings of blood chemistry: Secondary | ICD-10-CM

## 2020-05-29 DIAGNOSIS — Z79899 Other long term (current) drug therapy: Secondary | ICD-10-CM

## 2020-05-29 DIAGNOSIS — E291 Testicular hypofunction: Secondary | ICD-10-CM

## 2020-05-29 DIAGNOSIS — G4733 Obstructive sleep apnea (adult) (pediatric): Secondary | ICD-10-CM

## 2020-05-29 DIAGNOSIS — Z79890 Hormone replacement therapy (postmenopausal): Secondary | ICD-10-CM

## 2020-05-29 DIAGNOSIS — K760 Fatty (change of) liver, not elsewhere classified: Secondary | ICD-10-CM

## 2020-05-29 DIAGNOSIS — J4540 Moderate persistent asthma, uncomplicated: Secondary | ICD-10-CM

## 2020-05-29 DIAGNOSIS — Z5181 Encounter for therapeutic drug level monitoring: Secondary | ICD-10-CM

## 2020-05-29 DIAGNOSIS — E782 Mixed hyperlipidemia: Secondary | ICD-10-CM

## 2020-05-29 NOTE — Patient Instructions (Signed)
Call and discuss switching to BiPAP as recommended.   We will be in touch with your lab results and I will see you back for your CPE or sooner if needed.

## 2020-05-29 NOTE — Progress Notes (Signed)
   Subjective:    Patient ID: David Kent, male    DOB: [birth year], 45 y.o.   MRN: [id number]  HPI Chief Complaint  Patient presents with  . med check    Fasting med check. Needs refill on testosterone   He is here for a medication management visit.   Testosterone level higher than goal. He cut back to 3 pumps approximately 4 weeks ago and cannot tell any difference. Needs rechecked.   OSA- CPAP since January but states he is supposed to follow up and switch to BiPAP.   Elevated liver enzymes- improving.  Has not been drinking alcohol for the past 3 months.  Fatty liver   Obesity - has lost weight since knee and quadricep surgery   Asthma- using Symbicort once daily and rarely needs albuterol   Mixed HL- on statin. Fasting today.   No other concerns.   Denies fever, chills, dizziness, chest pain, palpitations, shortness of breath, abdominal pain, N/V/D, urinary symptoms, LE edema.     Review of Systems Pertinent positives and negatives in the history of present illness.     Objective:   Physical Exam BP 124/70   Pulse 82   Wt 227 lb 3.2 oz (103.1 kg)   SpO2 97%   BMI 31.69 kg/m   Alert and in no distress. Cardiac exam shows a regular rate. Lungs are clear to auscultation.      Assessment & Plan:  Hypogonadism in male - Plan: Testosterone, PSA -he has cut back to 3 pumps. Reports still feeling well and cannot tell any change in symptoms.  We will check his testosterone level and PSA. Follow up  OSA (obstructive sleep apnea) - he will follow up with Dr. Mayford Knife as recommended for BiPAP  Mixed hyperlipidemia - Plan: Lipid panel -continue statin. Follow up pending lipid results.   Fatty liver -continue to avoid alcohol and eat a low fat diet   Elevated LFTs -improved. Continue avoiding alcohol and low fat diet   Moderate persistent asthma without complication -controlled.   Medication management - Plan: Lipid panel, Testosterone  Encounter for  monitoring testosterone replacement therapy - Plan: PSA

## 2020-05-30 LAB — PSA: Prostate Specific Ag, Serum: 0.8 ng/mL (ref 0.0–4.0)

## 2020-05-30 LAB — TESTOSTERONE: Testosterone: 804 ng/dL (ref 264–916)

## 2020-05-30 LAB — LIPID PANEL
Chol/HDL Ratio: 5 ratio (ref 0.0–5.0)
Cholesterol, Total: 180 mg/dL (ref 100–199)
HDL: 36 mg/dL — ABNORMAL LOW (ref >39)
LDL Chol Calc (NIH): 111 mg/dL — ABNORMAL HIGH (ref 0–99)
Triglycerides: 190 mg/dL — ABNORMAL HIGH (ref 0–149)
VLDL Cholesterol Cal: 33 mg/dL (ref 5–40)

## 2020-05-30 MED ORDER — TESTOSTERONE 20.25 MG/ACT (1.62%) TD GEL: 3.0000 | Freq: Every morning | TRANSDERMAL | 0 refills | Status: DC

## 2020-06-11 ENCOUNTER — Telehealth: Payer: Self-pay | Admitting: Internal Medicine

## 2020-06-11 NOTE — Telephone Encounter (Signed)
FW: Safety and Health Notification Received: Leary Roca, Zorita Pang, NP-C  Herminio Commons A, CMA Please provide a copy of this to the patient. He should be aware of the concerns regarding testosterone replacement and abnormal liver function. I recommend a GI consult to determine if he is safe to continue on testosterone in their opinion.     Sent patient a copy and asked that he let us know if he wants GI referral

## 2020-07-12 ENCOUNTER — Telehealth: Payer: Self-pay

## 2020-07-12 NOTE — Telephone Encounter (Signed)
P.A. TESTOSTERONE GEL renewal completed on cover my meds and response states no P.A. needed, covered by plan.

## 2020-07-23 ENCOUNTER — Other Ambulatory Visit: Payer: Self-pay | Admitting: Family Medicine

## 2020-07-23 DIAGNOSIS — E291 Testicular hypofunction: Secondary | ICD-10-CM

## 2020-07-23 DIAGNOSIS — E782 Mixed hyperlipidemia: Secondary | ICD-10-CM

## 2020-07-23 MED ORDER — TESTOSTERONE 20.25 MG/ACT (1.62%) TD GEL: 3.0000 | Freq: Every morning | TRANSDERMAL | 1 refills | Status: DC

## 2020-07-23 MED ORDER — ROSUVASTATIN CALCIUM 20 MG PO TABS: 20.0000 mg | ORAL_TABLET | Freq: Every day | ORAL | 2 refills | Status: DC

## 2020-10-28 ENCOUNTER — Other Ambulatory Visit: Payer: Self-pay | Admitting: Family Medicine

## 2020-10-28 DIAGNOSIS — E291 Testicular hypofunction: Secondary | ICD-10-CM

## 2020-10-28 NOTE — Telephone Encounter (Signed)
[facility] is requesting to fill pt testosterone. Please advise [facility] 

## 2020-11-28 ENCOUNTER — Other Ambulatory Visit: Payer: Self-pay

## 2020-11-28 ENCOUNTER — Ambulatory Visit (INDEPENDENT_AMBULATORY_CARE_PROVIDER_SITE_OTHER): Payer: Managed Care, Other (non HMO)

## 2020-11-28 DIAGNOSIS — Z23 Encounter for immunization: Secondary | ICD-10-CM | POA: Diagnosis not present

## 2020-12-01 ENCOUNTER — Encounter: Payer: Managed Care, Other (non HMO) | Admitting: Family Medicine

## 2020-12-02 ENCOUNTER — Encounter (HOSPITAL_BASED_OUTPATIENT_CLINIC_OR_DEPARTMENT_OTHER): Payer: Self-pay | Admitting: Emergency Medicine

## 2020-12-02 ENCOUNTER — Ambulatory Visit: Payer: Managed Care, Other (non HMO) | Admitting: Medical

## 2020-12-02 ENCOUNTER — Ambulatory Visit: Payer: Self-pay | Admitting: *Deleted

## 2020-12-02 ENCOUNTER — Other Ambulatory Visit: Payer: Self-pay

## 2020-12-02 ENCOUNTER — Emergency Department (HOSPITAL_BASED_OUTPATIENT_CLINIC_OR_DEPARTMENT_OTHER): Payer: Managed Care, Other (non HMO) | Admitting: Radiology

## 2020-12-02 ENCOUNTER — Emergency Department (HOSPITAL_BASED_OUTPATIENT_CLINIC_OR_DEPARTMENT_OTHER)
Admission: EM | Admit: 2020-12-02 | Discharge: 2020-12-02 | Disposition: A | Discharge status: Home / Self Care | Payer: Managed Care, Other (non HMO) | Attending: Emergency Medicine | Admitting: Emergency Medicine

## 2020-12-02 DIAGNOSIS — J45909 Unspecified asthma, uncomplicated: Secondary | ICD-10-CM | POA: Insufficient documentation

## 2020-12-02 DIAGNOSIS — R002 Palpitations: Secondary | ICD-10-CM | POA: Insufficient documentation

## 2020-12-02 DIAGNOSIS — Z7951 Long term (current) use of inhaled steroids: Secondary | ICD-10-CM | POA: Insufficient documentation

## 2020-12-02 DIAGNOSIS — Z87891 Personal history of nicotine dependence: Secondary | ICD-10-CM | POA: Insufficient documentation

## 2020-12-02 LAB — BASIC METABOLIC PANEL
Anion gap: 11 (ref 5–15)
BUN: 13 mg/dL (ref 6–20)
CO2: 25 mmol/L (ref 22–32)
Calcium: 9.5 mg/dL (ref 8.9–10.3)
Chloride: 102 mmol/L (ref 98–111)
Creatinine, Ser: 1.11 mg/dL (ref 0.61–1.24)
GFR, Estimated: >60 mL/min (ref >60)
Glucose, Bld: 91 mg/dL (ref 70–99)
Potassium: 3.7 mmol/L (ref 3.5–5.1)
Sodium: 138 mmol/L (ref 135–145)

## 2020-12-02 LAB — CBC
HCT: 53.2 % — ABNORMAL HIGH (ref 39.0–52.0)
Hemoglobin: 18.4 g/dL — ABNORMAL HIGH (ref 13.0–17.0)
MCH: 31.7 pg (ref 26.0–34.0)
MCHC: 34.6 g/dL (ref 30.0–36.0)
MCV: 91.7 fL (ref 80.0–100.0)
Platelets: 299 10*3/uL (ref 150–400)
RBC: 5.8 MIL/uL (ref 4.22–5.81)
RDW: 13.1 % (ref 11.5–15.5)
WBC: 8 10*3/uL (ref 4.0–10.5)
nRBC: 0 % (ref 0.0–0.2)

## 2020-12-02 LAB — TROPONIN I (HIGH SENSITIVITY): Troponin I (High Sensitivity): 5 ng/L (ref <18)

## 2020-12-02 NOTE — ED Triage Notes (Signed)
Pt arrives to ED with c/o of heart palpitations. He reports this started this morning at 5am. He reports this is happening every 15-20 minutes. These last 10 seconds. He denies CP and SOB.

## 2020-12-02 NOTE — Telephone Encounter (Signed)
C/o "heart flutters" happening on and off since 5 am this morning. Lasting 10-20 seconds at  a time. HR 74 bpm now. Denies "heart flutters" or palpitations now. Denies chest pain, difficulty breathing, dizziness, lightheadedness, sweating or nausea. Patient reports he has called PCP and was recommended to go to ED. Instructed patient to go to ED and patient would like to know if he goes to [facility] ED would they complete EKG. Instructed patient to go to closest ED and [facility] will complete EKG if ordered. [facility] will not admit to hospital and may require patient to go to [facility] campus if admission / observation is required. Patient verbalized understanding and to call 911 if symptoms worsen.

## 2020-12-02 NOTE — ED Provider Notes (Signed)
[facility] EMERGENCY DEPARTMENT Provider Note  CSN: [id number] Arrival date & time: 12/02/20 1224    History Chief Complaint  Patient presents with   Palpitations    David Kent is a 45 y.o. male with history of HLD and low-T reports onset of palpitations this morning while in the shower. Felt like his heart was fluttering for a few seconds then went away. Symptoms returned and left multiple times through the morning but have become less frequent this afternoon. Denies any CP, SOB, N/V/D. He drinks coffee although symptoms started before that this morning. He is on testosterone replacement but denies any other stimulant medications or supplements. No tobacco, alcohol or drug use.    Past Medical History:  Diagnosis Date   Childhood asthma    Elevated LFTs    Fatty liver 06/05/2018   Former smoker    Hyperlipidemia    Hypogonadism in male 07/16/2019   Low serum testosterone level 05/31/2019   Vitamin D deficiency 05/31/2019    Past Surgical History:  Procedure Laterality Date   LAPAROSCOPIC APPENDECTOMY N/A 09/01/2017   Procedure: APPENDECTOMY LAPAROSCOPIC;  Surgeon: Manus Rudd, MD;  Location: [facility] OR;  Service: General;  Laterality: N/A;    Family History  Problem Relation Age of Onset   Hyperlipidemia Mother    Heart disease Father 65       bypass   Hyperlipidemia Father    Cancer Maternal Grandmother        pancreas?   Lung cancer Maternal Grandfather     Social History   Tobacco Use   Smoking status: Former    Packs/day: 0.50    Years: 15.00    Pack years: 7.50    Types: Cigarettes   Smokeless tobacco: Never  Vaping Use   Vaping Use: Never used  Substance Use Topics   Alcohol use: Yes    Comment: most days    Drug use: Never     Home Medications Prior to Admission medications   Medication Sig Start Date End Date Taking? Authorizing Provider  Cholecalciferol (VITAMIN D PO) Take 1 tablet by mouth daily.   Yes [provider]   Multiple Vitamin (MULTIVITAMIN WITH MINERALS) TABS tablet Take 1 tablet by mouth daily.   Yes [provider]  rosuvastatin (CRESTOR) 20 MG tablet Take 1 tablet (20 mg total) by mouth daily. 07/23/20  Yes Ronnald Nian, MD  sildenafil (REVATIO) 20 MG tablet TAKE 1 TABLET BY MOUTH EVERY DAY AS NEEDED 04/29/20  Yes Henson, Vickie L, PA-C  Testosterone 20.25 MG/ACT (1.62%) GEL PLACE 3 PUMPS ONTO THE SKIN EVERY MORNING. 10/28/20  Yes Ronnald Nian, MD  albuterol (VENTOLIN HFA) 108 (90 Base) MCG/ACT inhaler INHALE 2 PUFFS INTO THE LUNGS EVERY 6 HOURS AS NEEDED FOR WHEEZING OR SHORTNESS OF BREATH Patient not taking: No sig reported 05/23/20   Avanell Shackleton, PA-C  SYMBICORT 160-4.5 MCG/ACT inhaler INHALE 2 PUFFS INTO THE LUNGS TWICE DAILY Patient not taking: Reported on 12/02/2020 05/29/20   Avanell Shackleton, PA-C     Allergies    Patient has no known allergies.   Review of Systems   Review of Systems A comprehensive review of systems was completed and negative except as noted in HPI.    Physical Exam BP 139/88   Pulse 62   Temp 98 F (36.7 C) (Temporal)   Resp 17   Ht 5\' 11"  (1.803 m)   Wt 97.5 kg   SpO2 98%   BMI 29.99 kg/m  Physical Exam Vitals and nursing note reviewed.  Constitutional:      Appearance: Normal appearance.  HENT:     Head: Normocephalic and atraumatic.     Nose: Nose normal.     Mouth/Throat:     Mouth: Mucous membranes are moist.  Eyes:     Extraocular Movements: Extraocular movements intact.     Conjunctiva/sclera: Conjunctivae normal.  Cardiovascular:     Rate and Rhythm: Normal rate.  Pulmonary:     Effort: Pulmonary effort is normal.     Breath sounds: Normal breath sounds.  Abdominal:     General: Abdomen is flat.     Palpations: Abdomen is soft.     Tenderness: There is no abdominal tenderness.  Musculoskeletal:        General: No swelling. Normal range of motion.     Cervical back: Neck supple.  Skin:    General: Skin is warm  and dry.  Neurological:     General: No focal deficit present.     Mental Status: He is alert.  Psychiatric:        Mood and Affect: Mood normal.     ED Results / Procedures / Treatments   Labs (all labs ordered are listed, but only abnormal results are displayed) Labs Reviewed  CBC - Abnormal; Notable for the following components:      Result Value   Hemoglobin 18.4 (*)    HCT 53.2 (*)    All other components within normal limits  BASIC METABOLIC PANEL  TROPONIN I (HIGH SENSITIVITY)    EKG EKG Interpretation  Date/Time:  Tuesday December 02 2020 12:47:25 EST Ventricular Rate:  63 PR Interval:  172 QRS Duration: 106 QT Interval:  420 QTC Calculation: 429 R Axis:   32 Text Interpretation: Normal sinus rhythm Incomplete right bundle branch block Borderline ECG No old tracing to compare Confirmed by Susy Frizzle [phone]) on 12/02/2020 12:57:17 PM   Radiology DG Chest 2 View  Result Date: 12/02/2020 CLINICAL DATA:  Chest pain and palpitations beginning today. EXAM: CHEST - 2 VIEW COMPARISON:  06/07/2018 FINDINGS: Heart size is normal. Mediastinal shadows are normal. The lungs are clear. No bronchial thickening. No infiltrate, mass, effusion or collapse. Pulmonary vascularity is normal. No bony abnormality. IMPRESSION: Normal chest Electronically Signed   By: Paulina Fusi M.D.   On: 12/02/2020 12:59    Procedures Procedures  Medications Ordered in the ED Medications - No data to display   MDM Rules/Calculators/A&P MDM Patient here with nonspecific palpitations, none seen on monitor during my evaluation. He felt some mild symptoms around 1400hrs while on our monitor, but review of the monitor tracing does not show any signs of PVCs, PACs, afib/flutter etc during that time frame. Labs ordered din the ED are unremarkable, Hgb is mildly elevated but otherwise no concerning findings. CXR is clear. Recommend he avoid stimulants for now, follow up with Cardiology for further  evaluation and consideration of home heart monitoring/ZIO Patch, etc.   ED Course  I have reviewed the triage vital signs and the nursing notes.  Pertinent labs & imaging results that were available during my care of the patient were reviewed by me and considered in my medical decision making (see chart for details).     Final Clinical Impression(s) / ED Diagnoses Final diagnoses:  Palpitations    Rx / DC Orders ED Discharge Orders     None        Pollyann Savoy, MD 12/02/20 1422

## 2020-12-02 NOTE — Telephone Encounter (Signed)
Reason for Disposition  [1] Heart beating very rapidly (e.g., > 140 / minute) AND [2] not present now  (Exception: during exercise)  Answer Assessment - Initial Assessment Questions 1. DESCRIPTION: "Please describe your heart rate or heartbeat that you are having" (e.g., fast/slow, regular/irregular, skipped or extra beats, "palpitations")     Fast irregular "flutters"  2. ONSET: "When did it start?" (Minutes, hours or days)      5 am  today  3. DURATION: "How long does it last" (e.g., seconds, minutes, hours)     10-20 sec 4. PATTERN "Does it come and go, or has it been constant since it started?"  "Does it get worse with exertion?"   "Are you feeling it now?"     Comes and goes  5. TAP: "Using your hand, can you tap out what you are feeling on a chair or table in front of you, so that I can hear?" (Note: not all patients can do this)       Na  6. HEART RATE: "Can you tell me your heart rate?" "How many beats in 15 seconds?"  (Note: not all patients can do this)       74 bpm at this time  7. RECURRENT SYMPTOM: "Have you ever had this before?" If Yes, ask: "When was the last time?" and "What happened that time?"      No  8. CAUSE: "What do you think is causing the palpitations?"     Not sure  9. CARDIAC HISTORY: "Do you have any history of heart disease?" (e.g., heart attack, angina, bypass surgery, angioplasty, arrhythmia)      No . Father had bypass surgery  10. OTHER SYMPTOMS: "Do you have any other symptoms?" (e.g., dizziness, chest pain, sweating, difficulty breathing)       no 11. PREGNANCY: "Is there any chance you are pregnant?" "When was your last menstrual period?"       no  Protocols used: Heart Rate and Heartbeat Questions-A-AH

## 2020-12-23 ENCOUNTER — Telehealth: Payer: Self-pay | Admitting: Family Medicine

## 2020-12-23 NOTE — Telephone Encounter (Signed)
Done K[facility] 

## 2020-12-23 NOTE — Telephone Encounter (Signed)
[facility] sent refill for testosterone 1.62 gel please send to the [id number] - [place], [place] - [address]

## 2020-12-29 ENCOUNTER — Other Ambulatory Visit: Payer: Self-pay

## 2020-12-29 ENCOUNTER — Ambulatory Visit (INDEPENDENT_AMBULATORY_CARE_PROVIDER_SITE_OTHER): Payer: Managed Care, Other (non HMO) | Admitting: Family Medicine

## 2020-12-29 ENCOUNTER — Encounter: Payer: Self-pay | Admitting: Family Medicine

## 2020-12-29 VITALS — BP 112/68 | HR 67 | Temp 97.0°F | Wt 221.4 lb

## 2020-12-29 DIAGNOSIS — Z79899 Other long term (current) drug therapy: Secondary | ICD-10-CM | POA: Diagnosis not present

## 2020-12-29 DIAGNOSIS — J4540 Moderate persistent asthma, uncomplicated: Secondary | ICD-10-CM

## 2020-12-29 DIAGNOSIS — E291 Testicular hypofunction: Secondary | ICD-10-CM | POA: Diagnosis not present

## 2020-12-29 DIAGNOSIS — G4733 Obstructive sleep apnea (adult) (pediatric): Secondary | ICD-10-CM | POA: Diagnosis not present

## 2020-12-29 DIAGNOSIS — Z125 Encounter for screening for malignant neoplasm of prostate: Secondary | ICD-10-CM | POA: Diagnosis not present

## 2020-12-29 DIAGNOSIS — I4510 Unspecified right bundle-branch block: Secondary | ICD-10-CM | POA: Insufficient documentation

## 2020-12-29 NOTE — Progress Notes (Signed)
   Subjective:    Patient ID: David Kent, male    DOB: 12-30-1975, 45 y.o.   MRN: [id number]  HPI He is here for a recheck.  He continues on testosterone topical and is very happy with it.  He has definitely seen an increase in his energy, strength, stamina and libido.  He has had a 20 pound weight loss since he has been on testosterone and also notes less need for his pulmonary medications and is not using his CPAP as often as he has in the past.  He recently had an emergency room visit for evaluation of palpitations.  Apparently the EKG showed no changes he was having palpitations but nothing showed up.   Review of Systems     Objective:   Physical Exam Alert and in no distress.  Cardiac exam shows regular rhythm without murmurs or gallops.  Lungs are clear to auscultation.  Review of the EKG does show evidence of an incomplete right bundle.       Assessment & Plan:  Hypogonadism in male - Plan: Comprehensive metabolic panel, Testosterone  Screening for prostate cancer - Plan: PSA  Encounter for long-term (current) use of medications - Plan: Comprehensive metabolic panel  OSA (obstructive sleep apnea)  Moderate persistent asthma without complication  Incomplete RBBB I discussed the EKG with him.  He plans to follow-up with cardiology.  Then discussed OSA with him and recommend that at some point in the future we really address that issue to see if he truly needs the CPAP.  He was comfortable with that.  Also the same thing concerning his asthma.

## 2020-12-30 LAB — COMPREHENSIVE METABOLIC PANEL
ALT: 24 IU/L (ref 0–44)
AST: 24 IU/L (ref 0–40)
Albumin/Globulin Ratio: 2.2 (ref 1.2–2.2)
Albumin: 4.7 g/dL (ref 4.0–5.0)
Alkaline Phosphatase: 68 IU/L (ref 44–121)
BUN/Creatinine Ratio: 10 (ref 9–20)
BUN: 12 mg/dL (ref 6–24)
Bilirubin Total: 1.1 mg/dL (ref 0.0–1.2)
CO2: 26 mmol/L (ref 20–29)
Calcium: 9.6 mg/dL (ref 8.7–10.2)
Chloride: 102 mmol/L (ref 96–106)
Creatinine, Ser: 1.17 mg/dL (ref 0.76–1.27)
Globulin, Total: 2.1 g/dL (ref 1.5–4.5)
Glucose: 91 mg/dL (ref 70–99)
Potassium: 4.5 mmol/L (ref 3.5–5.2)
Sodium: 141 mmol/L (ref 134–144)
Total Protein: 6.8 g/dL (ref 6.0–8.5)
eGFR: 79 mL/min/{1.73_m2} (ref >59)

## 2020-12-30 LAB — TESTOSTERONE: Testosterone: 261 ng/dL — ABNORMAL LOW (ref 264–916)

## 2020-12-30 LAB — PSA: Prostate Specific Ag, Serum: 0.8 ng/mL (ref 0.0–4.0)

## 2021-01-01 ENCOUNTER — Ambulatory Visit: Payer: Managed Care, Other (non HMO) | Admitting: Internal Medicine

## 2021-01-28 ENCOUNTER — Ambulatory Visit: Payer: Managed Care, Other (non HMO) | Admitting: Internal Medicine

## 2021-02-07 ENCOUNTER — Other Ambulatory Visit: Payer: Self-pay | Admitting: Family Medicine

## 2021-02-07 DIAGNOSIS — E291 Testicular hypofunction: Secondary | ICD-10-CM

## 2021-02-09 ENCOUNTER — Telehealth: Payer: Self-pay

## 2021-02-09 NOTE — Telephone Encounter (Signed)
Pt. Called LM stating he needs a refill on his Testosterone gel to [facility] on [address]. Pt. Last apt was 12/29/20.

## 2021-02-09 NOTE — Telephone Encounter (Signed)
[facility] is requesting to fill pt tetstosterone. Please advise [facility]

## 2021-06-03 ENCOUNTER — Other Ambulatory Visit: Payer: Self-pay | Admitting: Family Medicine

## 2021-06-03 DIAGNOSIS — E782 Mixed hyperlipidemia: Secondary | ICD-10-CM

## 2021-06-30 ENCOUNTER — Other Ambulatory Visit: Payer: Self-pay | Admitting: Family Medicine

## 2021-06-30 DIAGNOSIS — E291 Testicular hypofunction: Secondary | ICD-10-CM

## 2021-07-01 ENCOUNTER — Other Ambulatory Visit: Payer: Self-pay | Admitting: Family Medicine

## 2021-07-01 DIAGNOSIS — E782 Mixed hyperlipidemia: Secondary | ICD-10-CM

## 2021-07-02 ENCOUNTER — Other Ambulatory Visit: Payer: Self-pay | Admitting: Physician Assistant

## 2021-07-02 DIAGNOSIS — E782 Mixed hyperlipidemia: Secondary | ICD-10-CM

## 2021-07-15 ENCOUNTER — Other Ambulatory Visit: Payer: Self-pay | Admitting: Family Medicine

## 2021-07-15 DIAGNOSIS — E782 Mixed hyperlipidemia: Secondary | ICD-10-CM

## 2021-07-17 ENCOUNTER — Encounter: Payer: Self-pay | Admitting: Internal Medicine

## 2021-07-17 ENCOUNTER — Other Ambulatory Visit: Payer: Self-pay | Admitting: Physician Assistant

## 2021-07-17 ENCOUNTER — Telehealth: Payer: Self-pay | Admitting: Physician Assistant

## 2021-07-17 DIAGNOSIS — E782 Mixed hyperlipidemia: Secondary | ICD-10-CM

## 2021-07-17 MED ORDER — ROSUVASTATIN CALCIUM 20 MG PO TABS: 20.0000 mg | ORAL_TABLET | Freq: Every day | ORAL | 1 refills | Status: DC

## 2021-07-17 NOTE — Telephone Encounter (Signed)
Refill of rosuvastatin ordered

## 2021-07-17 NOTE — Telephone Encounter (Signed)
Pt needs refill on his rosuvastatin, he mentioned he is completely out.

## 2021-08-15 ENCOUNTER — Other Ambulatory Visit: Payer: Self-pay | Admitting: Family Medicine

## 2021-08-15 DIAGNOSIS — E291 Testicular hypofunction: Secondary | ICD-10-CM

## 2021-08-17 ENCOUNTER — Telehealth: Payer: Self-pay | Admitting: Medical

## 2021-08-17 NOTE — Telephone Encounter (Signed)
Pt called about refill of medication. Sending to Vincenza Hews as Rosaura Carpenter is not in the office

## 2021-08-17 NOTE — Telephone Encounter (Signed)
Is this okay to refill? 

## 2021-08-17 NOTE — Telephone Encounter (Signed)
Testosterone refill being sent today since Dr. Lindie Spruce is out on vacation.  Make sure he has a follow-up soon for recheck and labs on this medication

## 2021-08-31 ENCOUNTER — Ambulatory Visit: Payer: Managed Care, Other (non HMO) | Admitting: Family Medicine

## 2021-09-10 ENCOUNTER — Ambulatory Visit: Payer: Managed Care, Other (non HMO) | Admitting: Family Medicine

## 2021-09-15 ENCOUNTER — Encounter: Payer: Self-pay | Admitting: Family Medicine

## 2021-09-15 ENCOUNTER — Ambulatory Visit: Payer: Managed Care, Other (non HMO) | Admitting: Family Medicine

## 2021-09-15 VITALS — BP 116/70 | HR 75 | Temp 97.5°F | Wt 234.6 lb

## 2021-09-15 DIAGNOSIS — E782 Mixed hyperlipidemia: Secondary | ICD-10-CM | POA: Diagnosis not present

## 2021-09-15 DIAGNOSIS — Z125 Encounter for screening for malignant neoplasm of prostate: Secondary | ICD-10-CM | POA: Diagnosis not present

## 2021-09-15 DIAGNOSIS — E291 Testicular hypofunction: Secondary | ICD-10-CM

## 2021-09-15 DIAGNOSIS — Z79899 Other long term (current) drug therapy: Secondary | ICD-10-CM | POA: Diagnosis not present

## 2021-09-15 NOTE — Progress Notes (Signed)
   Subjective:    Patient ID: David Kent, male    DOB: December 18, 1975, 46 y.o.   MRN: [id number]  HPI He is here for medication management.  He is now using testosterone 3 pumps per day.  He also continues on Crestor and has not had that checked in quite some time.  He has no particular other concerns or complaints.  He is scheduled for complete exam in November.   Review of Systems     Objective:   Physical Exam Alert and in no distress otherwise not examined       Assessment & Plan:  Hypogonadism in male - Plan: Testosterone  Mixed hyperlipidemia - Plan: Lipid panel  Encounter for long-term (current) use of medications - Plan: CBC with Differential/Platelet, Comprehensive metabolic panel, Lipid panel, Testosterone  Screening for prostate cancer - Plan: PSA He ate lunch around 11 and the blood was drawn around 2

## 2021-09-16 LAB — TESTOSTERONE: Testosterone: 261 ng/dL — ABNORMAL LOW (ref 264–916)

## 2021-09-16 LAB — CBC WITH DIFFERENTIAL/PLATELET
Basophils Absolute: 0 10*3/uL (ref 0.0–0.2)
Basos: 0 %
EOS (ABSOLUTE): 0.1 10*3/uL (ref 0.0–0.4)
Eos: 1 %
Hematocrit: 51.7 % — ABNORMAL HIGH (ref 37.5–51.0)
Hemoglobin: 17.9 g/dL — ABNORMAL HIGH (ref 13.0–17.7)
Immature Grans (Abs): 0 10*3/uL (ref 0.0–0.1)
Immature Granulocytes: 0 %
Lymphocytes Absolute: 1.7 10*3/uL (ref 0.7–3.1)
Lymphs: 28 %
MCH: 31.7 pg (ref 26.6–33.0)
MCHC: 34.6 g/dL (ref 31.5–35.7)
MCV: 92 fL (ref 79–97)
Monocytes Absolute: 0.5 10*3/uL (ref 0.1–0.9)
Monocytes: 9 %
Neutrophils Absolute: 3.5 10*3/uL (ref 1.4–7.0)
Neutrophils: 62 %
Platelets: 298 10*3/uL (ref 150–450)
RBC: 5.65 x10E6/uL (ref 4.14–5.80)
RDW: 13.1 % (ref 11.6–15.4)
WBC: 5.8 10*3/uL (ref 3.4–10.8)

## 2021-09-16 LAB — COMPREHENSIVE METABOLIC PANEL
ALT: 32 IU/L (ref 0–44)
AST: 25 IU/L (ref 0–40)
Albumin/Globulin Ratio: 2 (ref 1.2–2.2)
Albumin: 4.5 g/dL (ref 4.1–5.1)
Alkaline Phosphatase: 69 IU/L (ref 44–121)
BUN/Creatinine Ratio: 8 — ABNORMAL LOW (ref 9–20)
BUN: 11 mg/dL (ref 6–24)
Bilirubin Total: 1 mg/dL (ref 0.0–1.2)
CO2: 24 mmol/L (ref 20–29)
Calcium: 9.1 mg/dL (ref 8.7–10.2)
Chloride: 100 mmol/L (ref 96–106)
Creatinine, Ser: 1.39 mg/dL — ABNORMAL HIGH (ref 0.76–1.27)
Globulin, Total: 2.2 g/dL (ref 1.5–4.5)
Glucose: 94 mg/dL (ref 70–99)
Potassium: 4.3 mmol/L (ref 3.5–5.2)
Sodium: 141 mmol/L (ref 134–144)
Total Protein: 6.7 g/dL (ref 6.0–8.5)
eGFR: 64 mL/min/{1.73_m2} (ref >59)

## 2021-09-16 LAB — LIPID PANEL
Chol/HDL Ratio: 5.4 ratio — ABNORMAL HIGH (ref 0.0–5.0)
Cholesterol, Total: 179 mg/dL (ref 100–199)
HDL: 33 mg/dL — ABNORMAL LOW (ref >39)
LDL Chol Calc (NIH): 106 mg/dL — ABNORMAL HIGH (ref 0–99)
Triglycerides: 232 mg/dL — ABNORMAL HIGH (ref 0–149)
VLDL Cholesterol Cal: 40 mg/dL (ref 5–40)

## 2021-09-16 LAB — PSA: Prostate Specific Ag, Serum: 0.8 ng/mL (ref 0.0–4.0)

## 2021-09-16 NOTE — Addendum Note (Signed)
Addended by: Ronnald Nian on: 09/16/2021 01:44 PM   Modules accepted: Orders

## 2021-09-23 ENCOUNTER — Encounter: Payer: Self-pay | Admitting: Internal Medicine

## 2021-09-24 ENCOUNTER — Other Ambulatory Visit: Payer: Self-pay | Admitting: Family Medicine

## 2021-09-24 DIAGNOSIS — E291 Testicular hypofunction: Secondary | ICD-10-CM

## 2021-09-24 MED ORDER — TESTOSTERONE 1.62 % TD GEL: 4.0000 | Freq: Every day | TRANSDERMAL | 1 refills | Status: DC

## 2021-09-25 ENCOUNTER — Other Ambulatory Visit: Payer: Self-pay | Admitting: Medical

## 2021-09-25 DIAGNOSIS — E291 Testicular hypofunction: Secondary | ICD-10-CM

## 2021-09-25 NOTE — Telephone Encounter (Signed)
This was sent in yesterday

## 2021-10-03 ENCOUNTER — Telehealth: Payer: Self-pay | Admitting: Medical

## 2021-10-03 NOTE — Telephone Encounter (Signed)
P.A. TESTOSTERONE GEL  °

## 2021-10-16 ENCOUNTER — Other Ambulatory Visit: Payer: Managed Care, Other (non HMO)

## 2021-10-16 DIAGNOSIS — E291 Testicular hypofunction: Secondary | ICD-10-CM

## 2021-10-17 LAB — TESTOSTERONE: Testosterone: 478 ng/dL (ref 264–916)

## 2021-10-27 ENCOUNTER — Encounter: Payer: Self-pay | Admitting: Internal Medicine

## 2021-10-29 NOTE — Telephone Encounter (Signed)
Getting message that testosterone covered by plan.  Called pharmacy & went thru but for only one bottle 75 gms for $10.  Rejects for 2 bottles 150gms.  Called pt and informed

## 2021-12-09 ENCOUNTER — Encounter: Payer: Self-pay | Admitting: Medical

## 2021-12-09 ENCOUNTER — Ambulatory Visit: Payer: Managed Care, Other (non HMO) | Admitting: Medical

## 2021-12-09 VITALS — BP 110/70 | HR 76 | Ht 71.0 in | Wt 229.2 lb

## 2021-12-09 DIAGNOSIS — Z87891 Personal history of nicotine dependence: Secondary | ICD-10-CM

## 2021-12-09 DIAGNOSIS — Z23 Encounter for immunization: Secondary | ICD-10-CM | POA: Diagnosis not present

## 2021-12-09 DIAGNOSIS — G4733 Obstructive sleep apnea (adult) (pediatric): Secondary | ICD-10-CM | POA: Diagnosis not present

## 2021-12-09 DIAGNOSIS — Z0000 Encounter for general adult medical examination without abnormal findings: Secondary | ICD-10-CM

## 2021-12-09 DIAGNOSIS — J4540 Moderate persistent asthma, uncomplicated: Secondary | ICD-10-CM

## 2021-12-09 DIAGNOSIS — Z1211 Encounter for screening for malignant neoplasm of colon: Secondary | ICD-10-CM

## 2021-12-09 DIAGNOSIS — R7989 Other specified abnormal findings of blood chemistry: Secondary | ICD-10-CM

## 2021-12-09 DIAGNOSIS — E559 Vitamin D deficiency, unspecified: Secondary | ICD-10-CM

## 2021-12-09 DIAGNOSIS — D751 Secondary polycythemia: Secondary | ICD-10-CM

## 2021-12-09 DIAGNOSIS — E782 Mixed hyperlipidemia: Secondary | ICD-10-CM

## 2021-12-09 DIAGNOSIS — E291 Testicular hypofunction: Secondary | ICD-10-CM

## 2021-12-09 DIAGNOSIS — N529 Male erectile dysfunction, unspecified: Secondary | ICD-10-CM

## 2021-12-09 DIAGNOSIS — K760 Fatty (change of) liver, not elsewhere classified: Secondary | ICD-10-CM

## 2021-12-09 LAB — POCT URINALYSIS DIP (PROADVANTAGE DEVICE)
Bilirubin, UA: NEGATIVE
Blood, UA: NEGATIVE
Glucose, UA: NEGATIVE mg/dL
Ketones, POC UA: NEGATIVE mg/dL
Leukocytes, UA: NEGATIVE
Nitrite, UA: NEGATIVE
Protein Ur, POC: NEGATIVE mg/dL
Specific Gravity, Urine: 1.015
Urobilinogen, Ur: NEGATIVE
pH, UA: 7.5 (ref 5.0–8.0)

## 2021-12-09 LAB — BASIC METABOLIC PANEL
BUN/Creatinine Ratio: 16 (ref 9–20)
BUN: 18 mg/dL (ref 6–24)
CO2: 23 mmol/L (ref 20–29)
Calcium: 9.5 mg/dL (ref 8.7–10.2)
Chloride: 102 mmol/L (ref 96–106)
Creatinine, Ser: 1.14 mg/dL (ref 0.76–1.27)
Glucose: 90 mg/dL (ref 70–99)
Potassium: 4.6 mmol/L (ref 3.5–5.2)
Sodium: 140 mmol/L (ref 134–144)
eGFR: 81 mL/min/{1.73_m2} (ref >59)

## 2021-12-09 MED ORDER — ALBUTEROL SULFATE HFA 108 (90 BASE) MCG/ACT IN AERS: 2.0000 | INHALATION_SPRAY | Freq: Four times a day (QID) | RESPIRATORY_TRACT | 0 refills | Status: DC | PRN

## 2021-12-09 MED ORDER — SILDENAFIL CITRATE 20 MG PO TABS: 20.0000 mg | ORAL_TABLET | Freq: Every day | ORAL | 3 refills | Status: DC | PRN

## 2021-12-09 MED ORDER — TESTOSTERONE 1.62 % TD GEL: 4.0000 | Freq: Every day | TRANSDERMAL | 5 refills | Status: DC

## 2021-12-09 MED ORDER — ROSUVASTATIN CALCIUM 20 MG PO TABS: 20.0000 mg | ORAL_TABLET | Freq: Every day | ORAL | 3 refills | Status: DC

## 2021-12-09 NOTE — Patient Instructions (Signed)
This visit was a preventative care visit, also known as wellness visit or routine physical.   Topics typically include healthy lifestyle, diet, exercise, preventative care, vaccinations, sick and well care, proper use of emergency dept and after hours care, as well as other concerns.     Recommendations: Continue to return yearly for your annual wellness and preventative care visits.  This gives [place] a chance to discuss healthy lifestyle, exercise, vaccinations, review your chart record, and perform screenings where appropriate.  I recommend you see your eye doctor yearly for routine vision care.  I recommend you see your dentist yearly for routine dental care including hygiene visits twice yearly.   Vaccination recommendations were reviewed Immunization History  Administered Date(s) Administered   Influenza,inj,Quad PF,6+ Mos 09/13/2018, 11/30/2019, 11/28/2020   PFIZER(Purple Top)SARS-COV-2 Vaccination 04/12/2019, 05/07/2019, 11/20/2019   Pfizer Covid-19 Vaccine Bivalent Booster 45yrs & up 11/28/2020   Tdap 09/13/2018    Return at your convenience for flu shot, pneumococcal 23 vaccine and separately for covid booster   Screening for cancer: Colon cancer screening: We will refer you for screening colonoscopy  Testicular cancer screening You should do a monthly self testicular exam if you are between 45-30 years old  We discussed PSA, prostate exam, and prostate cancer screening risks/benefits.     Skin cancer screening: Check your skin regularly for new changes, growing lesions, or other lesions of concern Come in for evaluation if you have skin lesions of concern.  Lung cancer screening: If you have a greater than 20 pack year history of tobacco use, then you may qualify for lung cancer screening with a chest CT scan.   Please call your insurance company to inquire about coverage for this test.  We currently don't have screenings for other cancers besides breast, cervical,  colon, and lung cancers.  If you have a strong family history of cancer or have other cancer screening concerns, please let me know.    Bone health: Get at least 150 minutes of aerobic exercise weekly Get weight bearing exercise at least once weekly Bone density test:  A bone density test is an imaging test that uses a type of X-ray to measure the amount of calcium and other minerals in your bones. The test may be used to diagnose or screen you for a condition that causes weak or thin bones (osteoporosis), predict your risk for a broken bone (fracture), or determine how well your osteoporosis treatment is working. The bone density test is recommended for females 65 and older, or females or males <65 if certain risk factors such as thyroid disease, long term use of steroids such as for asthma or rheumatological issues, vitamin D deficiency, estrogen deficiency, family history of osteoporosis, self or family history of fragility fracture in first degree relative.    Heart health: Get at least 150 minutes of aerobic exercise weekly Limit alcohol It is important to maintain a healthy blood pressure and healthy cholesterol numbers  Heart disease screening: Screening for heart disease includes screening for blood pressure, fasting lipids, glucose/diabetes screening, BMI height to weight ratio, reviewed of smoking status, physical activity, and diet.    Goals include blood pressure 120/80 or less, maintaining a healthy lipid/cholesterol profile, preventing diabetes or keeping diabetes numbers under good control, not smoking or using tobacco products, exercising most days per week or at least 150 minutes per week of exercise, and eating healthy variety of fruits and vegetables, healthy oils, and avoiding unhealthy food choices like fried food, fast food,  high sugar and high cholesterol foods.    Other tests may possibly include EKG test, CT coronary calcium score, echocardiogram, exercise treadmill  stress test.     Medical care options: I recommend you continue to seek care here first for routine care.  We try really hard to have available appointments Monday through Friday daytime hours for sick visits, acute visits, and physicals.  Urgent care should be used for after hours and weekends for significant issues that cannot wait till the next day.  The emergency department should be used for significant potentially life-threatening emergencies.  The emergency department is expensive, can often have long wait times for less significant concerns, so try to utilize primary care, urgent care, or telemedicine when possible to avoid unnecessary trips to the emergency department.  Virtual visits and telemedicine have been introduced since the pandemic started in 2020, and can be convenient ways to receive medical care.  We offer virtual appointments as well to assist you in a variety of options to seek medical care.   Advanced Directives: I recommend you consider completing a Health Care Power of Attorney and Living Will.   These documents respect your wishes and help alleviate burdens on your loved ones if you were to become terminally ill or be in a position to need those documents enforced.    You can complete Advanced Directives yourself, have them notarized, then have copies made for our office, for you and for anybody you feel should have them in safe keeping.  Or, you can have an attorney prepare these documents.   If you haven't updated your Last Will and Testament in a while, it may be worthwhile having an attorney prepare these documents together and save on some costs.       Separate significant issues discussed: Red cells elevated/erythrocytosis  Most likely due to testosterone therapy This increases risk of blood clots  Low testosterone  Currently on 4 pumps daily, increased from 08/2021 and september  labs showed improvement Discussed risks and benefits of testosterone  therapy   Mixed dyslipidemia  Avoid high carb high fat foods, avoid lots of cheese, processed foods, and high sugars We many need to consider adding prescription fish oil of fenofibrate to lower triglycerides if they are high on next lab check Continue your Crestor cholesterol medication daily given familial hyperlipidemia   Elevated creatinine kidney lab in 08/2021 repeat lab today   Sleep apnea per prior study  You note that you use CPAP sometimes We reviewed prior study, risks of uncontrolled sleep apnea   History of vitamin D deficiency  Consider taking supplement vitamin D 1000 units daily OTC

## 2021-12-09 NOTE — Progress Notes (Signed)
Subjective:   HPI  David Kent is a 46 y.o. male who presents for Chief Complaint  Patient presents with   fasting cpe    Fasting cpe, no concerns     Patient Care Team: Kimala Horne, Kermit Balo, PA-C as PCP - General (Family Medicine) Sees dentist Sees eye doctor  Concerns: Wants to hold off on vaccines today so arm not sore over Thanksgiving holiday  Compliant with medications  OSA - uses CPAP sometimes but no fatigue, doesn't snore loud.  Thinks prior sleep study was not accurate as he didn't sleep much that night anyways   Reviewed their medical, surgical, family, social, medication, and allergy history and updated chart as appropriate.  Past Medical History:  Diagnosis Date   Asthma    Elevated LFTs    Fatty liver 06/05/2018   Former smoker    Hyperlipidemia    Hypogonadism in male 07/16/2019   Low serum testosterone level 05/31/2019   Vitamin D deficiency 05/31/2019    Past Surgical History:  Procedure Laterality Date   LAPAROSCOPIC APPENDECTOMY N/A 09/01/2017   Procedure: APPENDECTOMY LAPAROSCOPIC;  Surgeon: Manus Rudd, MD;  Location: [facility] OR;  Service: General;  Laterality: N/A;   TENDON REPAIR  2021   right quadriceps tendon repair after fall    Family History  Problem Relation Age of Onset   Hyperlipidemia Mother    Heart disease Father 84       bypass   Hyperlipidemia Father    Cancer Maternal Grandmother        pancreas?   Lung cancer Maternal Grandfather      Current Outpatient Medications:    Cholecalciferol (VITAMIN D PO), Take 1 tablet by mouth daily., Disp: , Rfl:    Multiple Vitamin (MULTIVITAMIN WITH MINERALS) TABS tablet, Take 1 tablet by mouth daily., Disp: , Rfl:    rosuvastatin (CRESTOR) 20 MG tablet, Take 1 tablet (20 mg total) by mouth daily., Disp: 90 tablet, Rfl: 1   sildenafil (REVATIO) 20 MG tablet, TAKE 1 TABLET BY MOUTH EVERY DAY AS NEEDED, Disp: 30 tablet, Rfl: 0   Testosterone 1.62 % GEL, Apply 4 Pump topically daily.,  Disp: 150 g, Rfl: 1   albuterol (VENTOLIN HFA) 108 (90 Base) MCG/ACT inhaler, INHALE 2 PUFFS INTO THE LUNGS EVERY 6 HOURS AS NEEDED FOR WHEEZING OR SHORTNESS OF BREATH (Patient not taking: Reported on 12/09/2021), Disp: 8.5 g, Rfl: 0   SYMBICORT 160-4.5 MCG/ACT inhaler, INHALE 2 PUFFS INTO THE LUNGS TWICE DAILY (Patient not taking: Reported on 12/02/2020), Disp: 10.2 g, Rfl: 1  No Known Allergies   Review of Systems Constitutional: -fever, -chills, -sweats, -unexpected weight change, -decreased appetite, -fatigue Allergy: -sneezing, -itching, -congestion Dermatology: -changing moles, --rash, -lumps ENT: -runny nose, -ear pain, -sore throat, -hoarseness, -sinus pain, -teeth pain, - ringing in ears, -hearing loss, -nosebleeds Cardiology: -chest pain, -palpitations, -swelling, -difficulty breathing when lying flat, -waking up short of breath Respiratory: -cough, -shortness of breath, -difficulty breathing with exercise or exertion, -wheezing, -coughing up blood Gastroenterology: -abdominal pain, -nausea, -vomiting, -diarrhea, -constipation, -blood in stool, -changes in bowel movement, -difficulty swallowing or eating Hematology: -bleeding, -bruising  Musculoskeletal: -joint aches, -muscle aches, -joint swelling, -back pain, -neck pain, -cramping, -changes in gait Ophthalmology: denies vision changes, eye redness, itching, discharge Urology: -burning with urination, -difficulty urinating, -blood in urine, -urinary frequency, -urgency, -incontinence Neurology: -headache, -weakness, -tingling, -numbness, -memory loss, -falls, -dizziness Psychology: -depressed mood, -agitation, -sleep problems Male GU: no testicular mass, pain, no lymph nodes swollen, no swelling, no  rash.     12/09/2021    9:17 AM 09/15/2021    1:40 PM 05/29/2020    8:58 AM 11/30/2019   10:11 AM 09/13/2018    8:41 AM  Depression screen PHQ 2/9  Decreased Interest 0 0 0 0 0  Down, Depressed, Hopeless 0 0 0 0 0  PHQ - 2 Score 0  0 0 0 0        Objective:  BP 110/70   Pulse 76   Ht 5\' 11"  (1.803 m)   Wt 229 lb 3.2 oz (104 kg)   BMI 31.97 kg/m   General appearance: alert, no distress, WD/WN, male Skin: scattered macules ,few seborrheic keratosis on back HEENT: normocephalic, conjunctiva/corneas normal, sclerae anicteric, PERRLA, EOMi, nares patent, no discharge or erythema, pharynx normal Oral cavity: MMM, tongue normal, teeth normal Neck: supple, no lymphadenopathy, no thyromegaly, no masses, normal ROM, no bruits Chest: non tender, normal shape and expansion Heart: RRR, normal S1, S2, no murmurs Lungs: CTA bilaterally, no wheezes, rhonchi, or rales Abdomen: +bs, soft, non tender, non distended, no masses, no hepatomegaly, no splenomegaly, no bruits Back: non tender, normal ROM, no scoliosis Musculoskeletal: upper extremities non tender, no obvious deformity, normal ROM throughout, lower extremities non tender, no obvious deformity, normal ROM throughout Extremities: no edema, no cyanosis, no clubbing Pulses: 2+ symmetric, upper and lower extremities, normal cap refill Neurological: alert, oriented x 3, CN2-12 intact, strength normal upper extremities and lower extremities, sensation normal throughout, DTRs 2+ throughout, no cerebellar signs, gait normal Psychiatric: normal affect, behavior normal, pleasant  GU/rectal - deferred   Assessment and Plan :   Encounter Diagnoses  Name Primary?   Encounter for health maintenance examination in adult Yes   Needs flu shot    OSA (obstructive sleep apnea)    Moderate persistent asthma without complication    Hypogonadism in male    Fatty liver    Vitamin D deficiency    Low serum testosterone level    Mixed hyperlipidemia    Former smoker    Erectile dysfunction, unspecified erectile dysfunction type    Need for pneumococcal vaccination    Screen for colon cancer    Elevated serum creatinine    Erythrocytosis     This visit was a preventative care  visit, also known as wellness visit or routine physical.   Topics typically include healthy lifestyle, diet, exercise, preventative care, vaccinations, sick and well care, proper use of emergency dept and after hours care, as well as other concerns.     Recommendations: Continue to return yearly for your annual wellness and preventative care visits.  This gives us a chance to discuss healthy lifestyle, exercise, vaccinations, review your chart record, and perform screenings where appropriate.  I recommend you see your eye doctor yearly for routine vision care.  I recommend you see your dentist yearly for routine dental care including hygiene visits twice yearly.   Vaccination recommendations were reviewed Immunization History  Administered Date(s) Administered   Influenza,inj,Quad PF,6+ Mos 09/13/2018, 11/30/2019, 11/28/2020   PFIZER(Purple Top)SARS-COV-2 Vaccination 04/12/2019, 05/07/2019, 11/20/2019   Pfizer Covid-19 Vaccine Bivalent Booster 2867yrs & up 11/28/2020   Tdap 09/13/2018    Return at your convenience for flu shot, pneumococcal 23 vaccine and separately for covid booster   Screening for cancer: Colon cancer screening: We will refer you for screening colonoscopy  Testicular cancer screening You should do a monthly self testicular exam if you are between 3520-45 years old  We discussed PSA, prostate  exam, and prostate cancer screening risks/benefits.     Skin cancer screening: Check your skin regularly for new changes, growing lesions, or other lesions of concern Come in for evaluation if you have skin lesions of concern.  Lung cancer screening: If you have a greater than 20 pack year history of tobacco use, then you may qualify for lung cancer screening with a chest CT scan.   Please call your insurance company to inquire about coverage for this test.  We currently don't have screenings for other cancers besides breast, cervical, colon, and lung cancers.  If you have a  strong family history of cancer or have other cancer screening concerns, please let me know.    Bone health: Get at least 150 minutes of aerobic exercise weekly Get weight bearing exercise at least once weekly Bone density test:  A bone density test is an imaging test that uses a type of X-ray to measure the amount of calcium and other minerals in your bones. The test may be used to diagnose or screen you for a condition that causes weak or thin bones (osteoporosis), predict your risk for a broken bone (fracture), or determine how well your osteoporosis treatment is working. The bone density test is recommended for females 65 and older, or females or males <65 if certain risk factors such as thyroid disease, long term use of steroids such as for asthma or rheumatological issues, vitamin D deficiency, estrogen deficiency, family history of osteoporosis, self or family history of fragility fracture in first degree relative.    Heart health: Get at least 150 minutes of aerobic exercise weekly Limit alcohol It is important to maintain a healthy blood pressure and healthy cholesterol numbers  Heart disease screening: Screening for heart disease includes screening for blood pressure, fasting lipids, glucose/diabetes screening, BMI height to weight ratio, reviewed of smoking status, physical activity, and diet.    Goals include blood pressure 120/80 or less, maintaining a healthy lipid/cholesterol profile, preventing diabetes or keeping diabetes numbers under good control, not smoking or using tobacco products, exercising most days per week or at least 150 minutes per week of exercise, and eating healthy variety of fruits and vegetables, healthy oils, and avoiding unhealthy food choices like fried food, fast food, high sugar and high cholesterol foods.    Other tests may possibly include EKG test, CT coronary calcium score, echocardiogram, exercise treadmill stress test.     Medical care  options: I recommend you continue to seek care here first for routine care.  We try really hard to have available appointments Monday through Friday daytime hours for sick visits, acute visits, and physicals.  Urgent care should be used for after hours and weekends for significant issues that cannot wait till the next day.  The emergency department should be used for significant potentially life-threatening emergencies.  The emergency department is expensive, can often have long wait times for less significant concerns, so try to utilize primary care, urgent care, or telemedicine when possible to avoid unnecessary trips to the emergency department.  Virtual visits and telemedicine have been introduced since the pandemic started in 2020, and can be convenient ways to receive medical care.  We offer virtual appointments as well to assist you in a variety of options to seek medical care.   Advanced Directives: I recommend you consider completing a Health Care Power of Attorney and Living Will.   These documents respect your wishes and help alleviate burdens on your loved ones if you were  to become terminally ill or be in a position to need those documents enforced.    You can complete Advanced Directives yourself, have them notarized, then have copies made for our office, for you and for anybody you feel should have them in safe keeping.  Or, you can have an attorney prepare these documents.   If you haven't updated your Last Will and Testament in a while, it may be worthwhile having an attorney prepare these documents together and save on some costs.       Separate significant issues discussed: Red cells elevated/erythrocytosis  Most likely due to testosterone therapy This increases risk of blood clots  Low testosterone  Currently on 4 pumps daily, increased from 08/2021 and September  labs showed improvement Discussed risks and benefits of testosterone therapy   Mixed dyslipidemia  Avoid high  carb high fat foods, avoid lots of cheese, processed foods, and high sugars We many need to consider adding prescription fish oil of fenofibrate to lower triglycerides if they are high on next lab check Continue your Crestor cholesterol medication daily given familial hyperlipidemia   Elevated creatinine kidney lab in 08/2021 repeat lab today   Sleep apnea per prior study  You note that you use CPAP sometimes We reviewed prior study, risks of uncontrolled sleep apnea   History of vitamin D deficiency  Consider taking supplement vitamin D 1000 units daily OTC   David Kent was seen today for fasting cpe.  Diagnoses and all orders for this visit:  Encounter for health maintenance examination in adult -     Basic metabolic panel -     POCT Urinalysis DIP (Proadvantage Device)  Needs flu shot -     Cancel: Flu Vaccine QUAD 94mo+IM (Fluarix, Fluzone & Alfiuria Quad PF)  OSA (obstructive sleep apnea)  Moderate persistent asthma without complication  Hypogonadism in male  Fatty liver  Vitamin D deficiency  Low serum testosterone level  Mixed hyperlipidemia  Former smoker  Erectile dysfunction, unspecified erectile dysfunction type  Need for pneumococcal vaccination  Screen for colon cancer -     Ambulatory referral to Gastroenterology  Elevated serum creatinine -     Basic metabolic panel -     POCT Urinalysis DIP (Proadvantage Device)  Erythrocytosis    Follow-up pending labs, yearly for physical

## 2021-12-11 ENCOUNTER — Other Ambulatory Visit: Payer: Self-pay | Admitting: Family Medicine

## 2021-12-23 ENCOUNTER — Other Ambulatory Visit (INDEPENDENT_AMBULATORY_CARE_PROVIDER_SITE_OTHER): Payer: Managed Care, Other (non HMO)

## 2021-12-23 ENCOUNTER — Telehealth: Payer: Self-pay

## 2021-12-23 DIAGNOSIS — Z23 Encounter for immunization: Secondary | ICD-10-CM | POA: Diagnosis not present

## 2021-12-23 NOTE — Telephone Encounter (Signed)
Pt. Is on the schedule for a pneumonia and flu shot wasn't sure if he needs a pneumonia shot.

## 2022-01-12 ENCOUNTER — Telehealth: Payer: Self-pay | Admitting: Internal Medicine

## 2022-01-12 NOTE — Telephone Encounter (Signed)
Sent PA. Sildenafil through covermymeds. Waiting on response

## 2022-01-13 NOTE — Telephone Encounter (Signed)
This has been denied. [employer]uer[employer] will only cover Sildenafil 20mg  tablet if the pt has Pulmonary Hypertension. It does not list what medications are covered under plan. Please advise

## 2022-01-13 NOTE — Telephone Encounter (Signed)
Pt states he does not know what is covered but does not need a refill. I will  cancel PA for this

## 2022-01-16 ENCOUNTER — Other Ambulatory Visit: Payer: Self-pay | Admitting: Medical

## 2022-01-16 DIAGNOSIS — J4540 Moderate persistent asthma, uncomplicated: Secondary | ICD-10-CM

## 2022-01-19 NOTE — Telephone Encounter (Signed)
Refill request last apt 12/09/21 next apt 12/15/22.

## 2022-01-21 ENCOUNTER — Other Ambulatory Visit: Payer: Self-pay | Admitting: Physician Assistant

## 2022-01-21 ENCOUNTER — Other Ambulatory Visit: Payer: Self-pay | Admitting: Family Medicine

## 2022-06-11 ENCOUNTER — Other Ambulatory Visit: Payer: Self-pay | Admitting: Medical

## 2022-06-11 DIAGNOSIS — E291 Testicular hypofunction: Secondary | ICD-10-CM

## 2022-06-11 DIAGNOSIS — J4540 Moderate persistent asthma, uncomplicated: Secondary | ICD-10-CM

## 2022-06-11 MED ORDER — ALBUTEROL SULFATE HFA 108 (90 BASE) MCG/ACT IN AERS
2.0000 | INHALATION_SPRAY | Freq: Four times a day (QID) | RESPIRATORY_TRACT | 0 refills | Status: DC | PRN
Start: 2022-06-11 — End: 2022-07-06

## 2022-06-11 NOTE — Telephone Encounter (Signed)
From: Octaviano Batty To: Office of Kristian Covey, [place] Sent: 06/11/2022 7:20 AM EDT Subject: Medication Renewal Request  Refills have been requested for the following medications:   albuterol (VENTOLIN HFA) 108 (90 Base) MCG/ACT inhaler [Shane Tysinger]  Preferred pharmacy: [id number] - [place], [place] - [address] AT [address] Delivery method: [employer]

## 2022-06-11 NOTE — Telephone Encounter (Signed)
Refill request last apt 12/09/21 next apt 12/15/22. 

## 2022-06-11 NOTE — Telephone Encounter (Signed)
Ok to refill testosterone 

## 2022-07-03 ENCOUNTER — Other Ambulatory Visit: Payer: Self-pay | Admitting: Medical

## 2022-07-03 DIAGNOSIS — J4540 Moderate persistent asthma, uncomplicated: Secondary | ICD-10-CM

## 2022-07-05 NOTE — Telephone Encounter (Signed)
Refill request last apt 12/09/21.

## 2022-07-12 IMAGING — DX DG CHEST 2V
2 series · 2 of 2 positions shown · non-contrast
Comparison: 06/07/2018

CLINICAL DATA: Chest pain and palpitations beginning today.

EXAM:
CHEST - 2 VIEW

[chest pa]
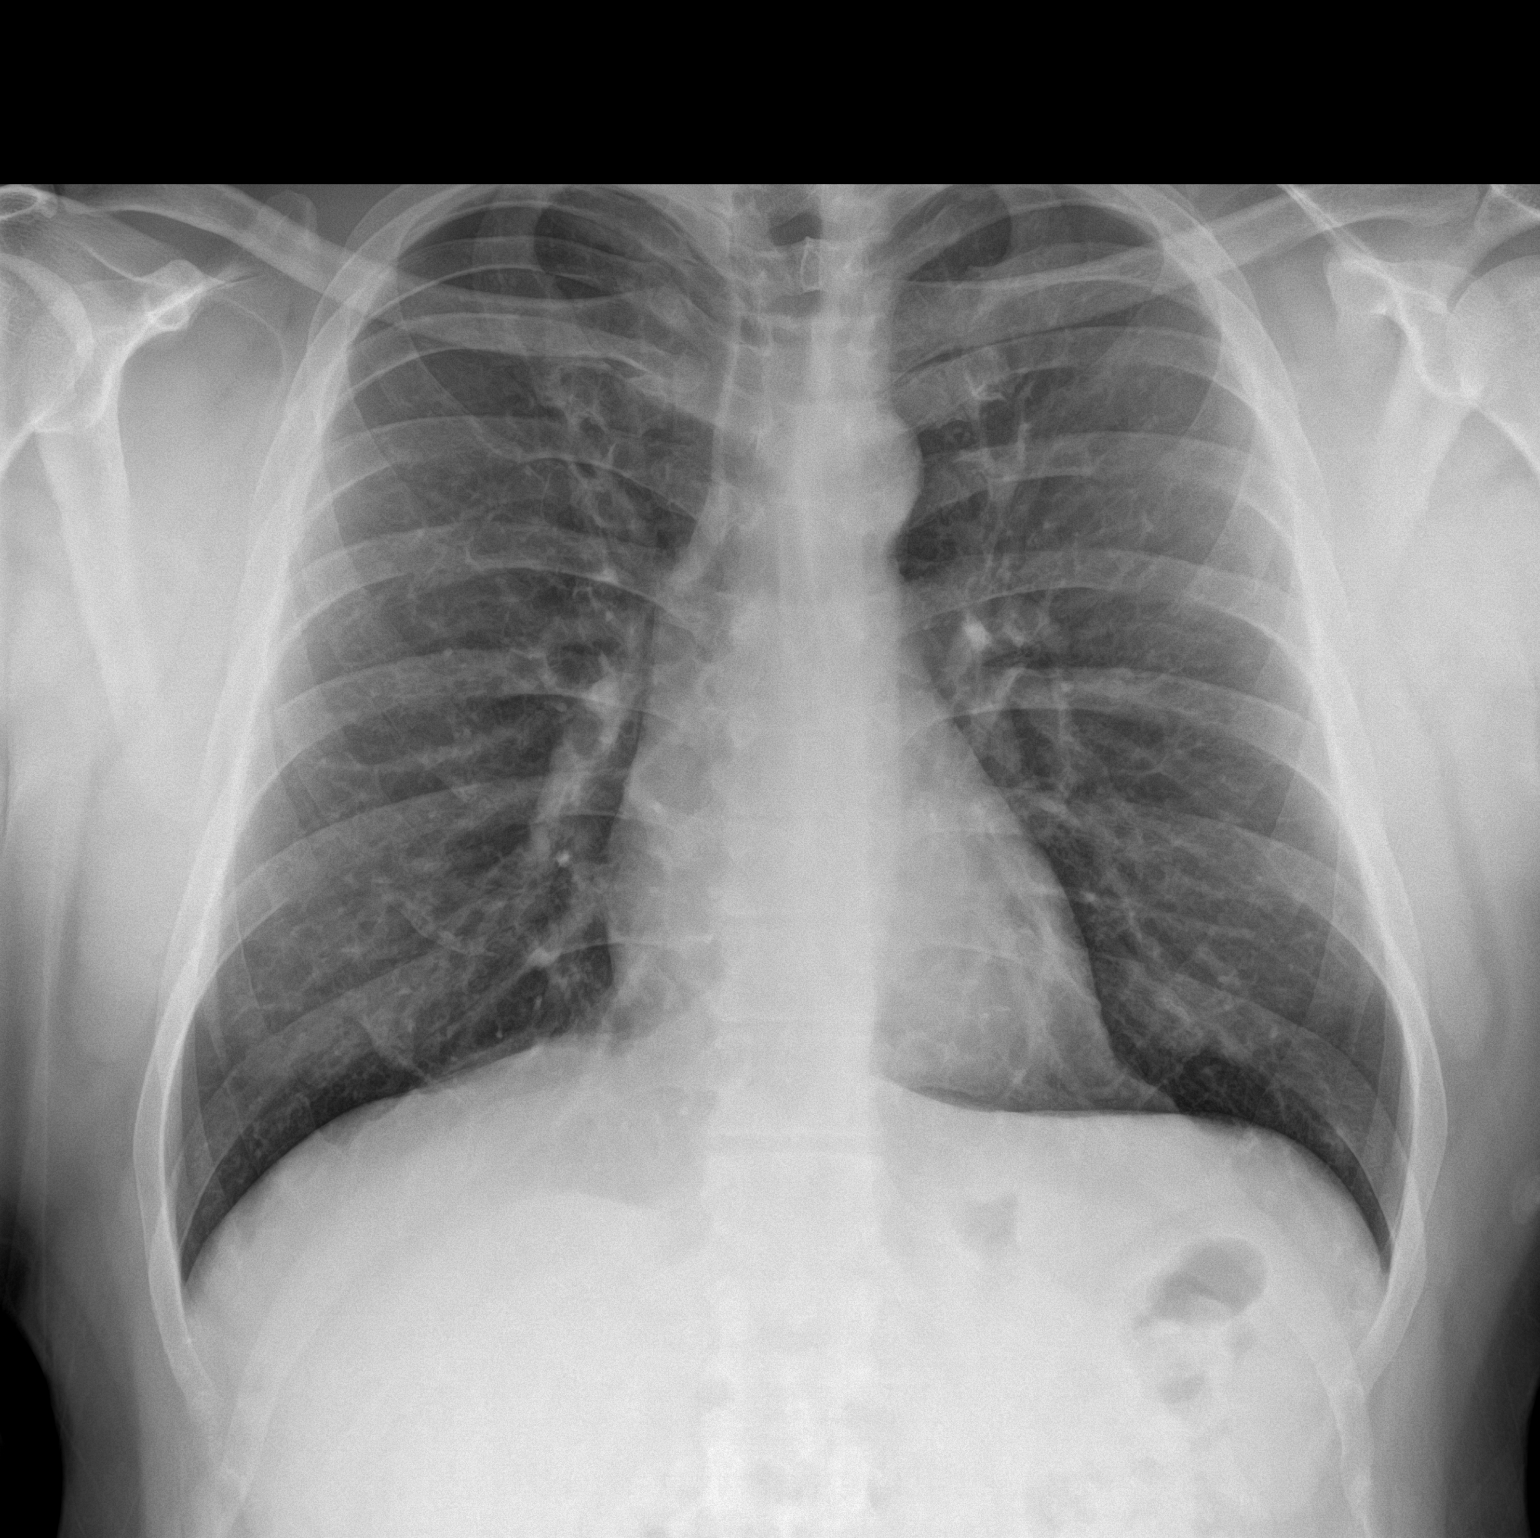

[chest lat]
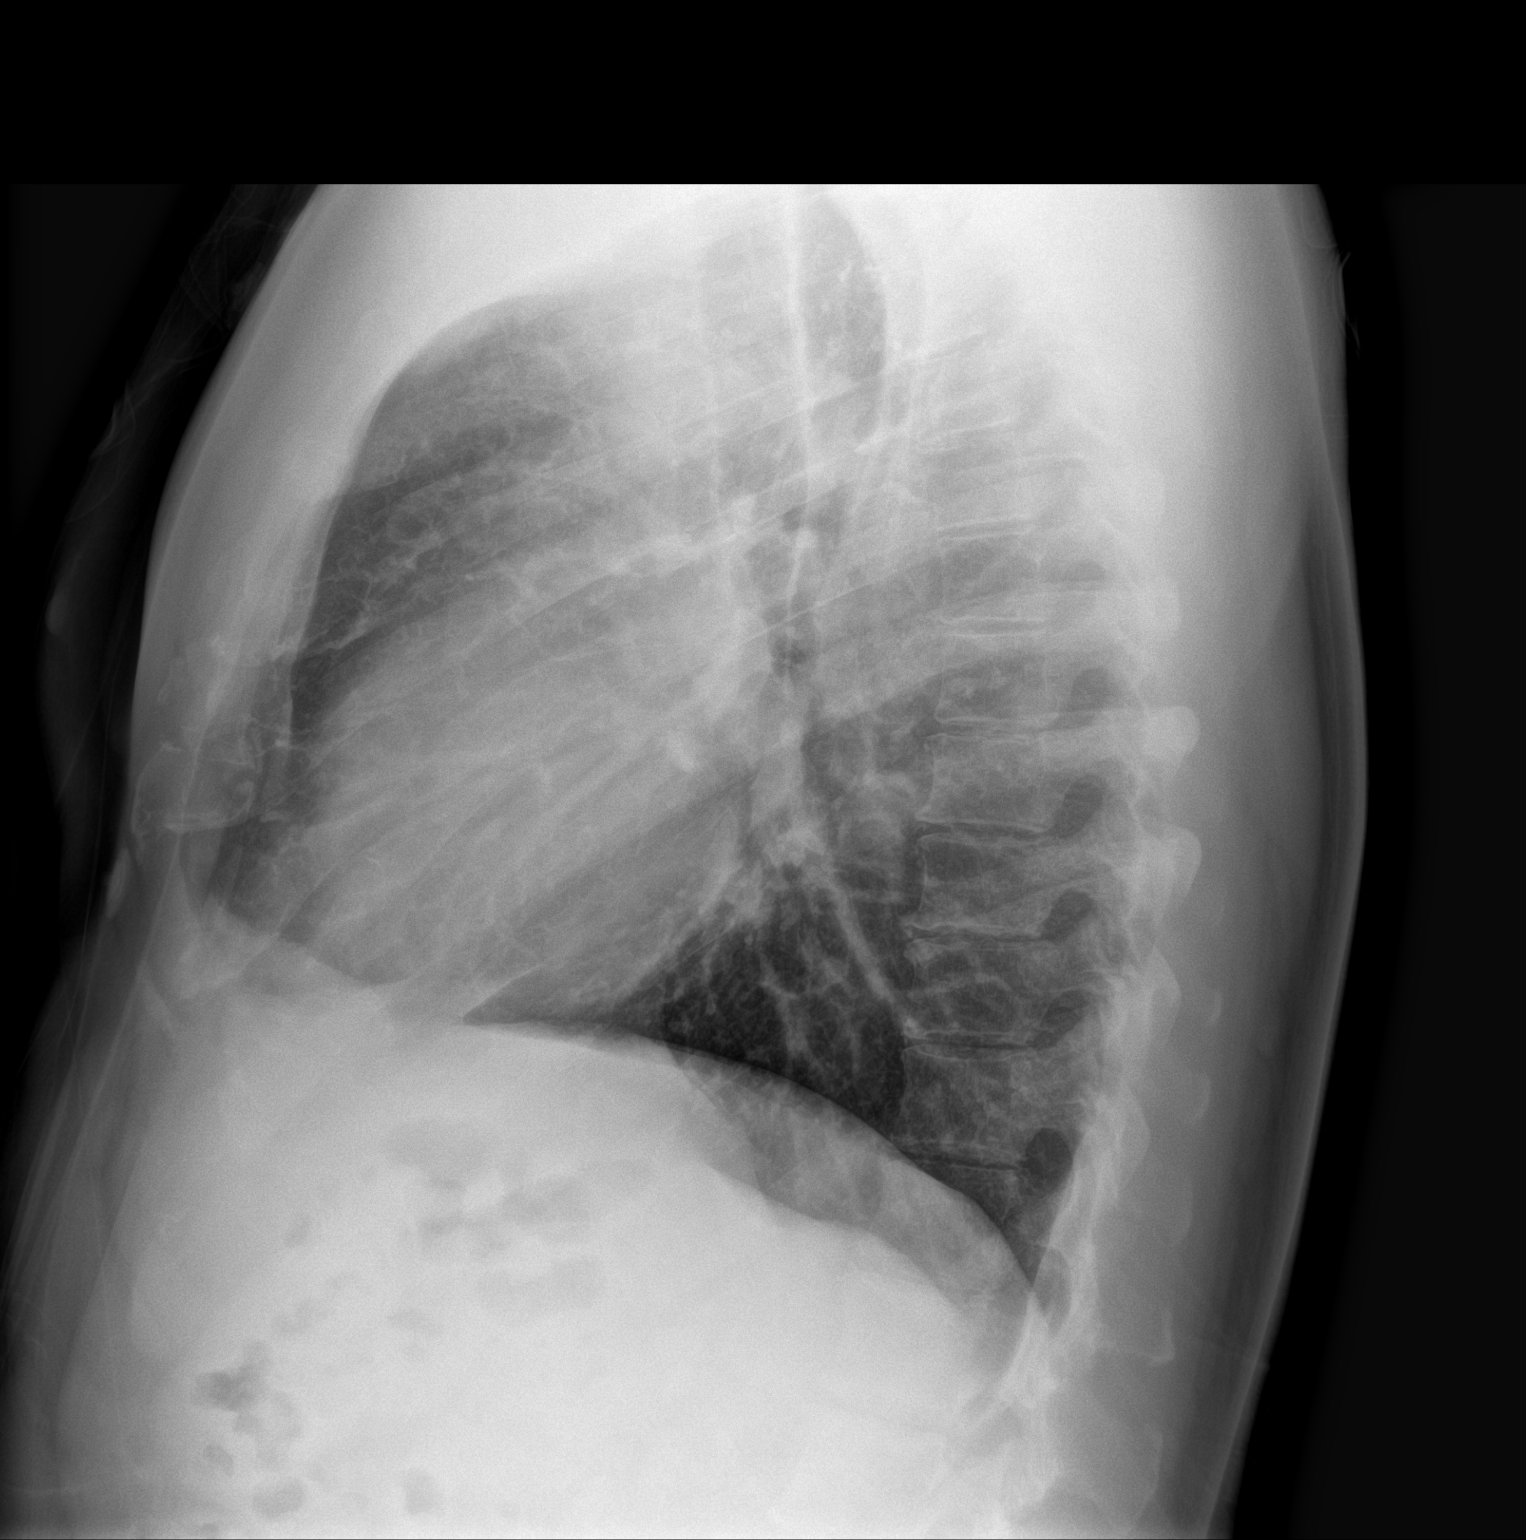

[2 of 2 positions shown; findings below may reference images not displayed]

FINDINGS: Heart size is normal. Mediastinal shadows are normal. The lungs are
clear. No bronchial thickening. No infiltrate, mass, effusion or
collapse. Pulmonary vascularity is normal. No bony abnormality.
IMPRESSION: Normal chest

## 2022-07-13 LAB — HM COLONOSCOPY

## 2022-07-15 ENCOUNTER — Encounter: Payer: Self-pay | Admitting: Internal Medicine

## 2022-07-21 ENCOUNTER — Encounter: Payer: Self-pay | Admitting: Internal Medicine

## 2022-08-19 ENCOUNTER — Ambulatory Visit: Payer: Managed Care, Other (non HMO) | Admitting: Medical

## 2022-08-19 ENCOUNTER — Other Ambulatory Visit: Payer: Self-pay | Admitting: Family Medicine

## 2022-08-19 ENCOUNTER — Encounter: Payer: Self-pay | Admitting: Medical

## 2022-08-19 ENCOUNTER — Encounter: Payer: Self-pay | Admitting: Internal Medicine

## 2022-08-19 VITALS — BP 128/76 | HR 66 | Wt 216.4 lb

## 2022-08-19 DIAGNOSIS — E291 Testicular hypofunction: Secondary | ICD-10-CM | POA: Diagnosis not present

## 2022-08-19 DIAGNOSIS — E559 Vitamin D deficiency, unspecified: Secondary | ICD-10-CM

## 2022-08-19 DIAGNOSIS — R7989 Other specified abnormal findings of blood chemistry: Secondary | ICD-10-CM | POA: Diagnosis not present

## 2022-08-19 DIAGNOSIS — E782 Mixed hyperlipidemia: Secondary | ICD-10-CM

## 2022-08-19 DIAGNOSIS — J4540 Moderate persistent asthma, uncomplicated: Secondary | ICD-10-CM | POA: Diagnosis not present

## 2022-08-19 DIAGNOSIS — Z79899 Other long term (current) drug therapy: Secondary | ICD-10-CM

## 2022-08-19 LAB — CBC
Hematocrit: 51.6 % — ABNORMAL HIGH (ref 37.5–51.0)
Hemoglobin: 17.8 g/dL — ABNORMAL HIGH (ref 13.0–17.7)
MCH: 31.5 pg (ref 26.6–33.0)
MCHC: 34.5 g/dL (ref 31.5–35.7)
MCV: 91 fL (ref 79–97)
Platelets: 288 10*3/uL (ref 150–450)
RBC: 5.65 x10E6/uL (ref 4.14–5.80)
RDW: 13.2 % (ref 11.6–15.4)
WBC: 5.3 10*3/uL (ref 3.4–10.8)

## 2022-08-19 LAB — TESTOSTERONE

## 2022-08-19 MED ORDER — TESTOSTERONE 1.62 % TD GEL
4.0000 | Freq: Every day | TRANSDERMAL | 5 refills | Status: DC
Start: 2022-08-19 — End: 2023-03-02

## 2022-08-19 NOTE — Progress Notes (Signed)
Subjective:  David Kent is a 47 y.o. male who presents for Chief Complaint  Patient presents with   Medical Management of Chronic Issues    Med check no other issues,      Here for med check.  Doing ok in general.   Low testosterone-compliant with testosterone gel 2 pumps each arm daily or 4 pumps total daily.  No particular side effects.  Does feel good response.    Hyperlipidemia-compliant with Crestor 20 mg daily.  No side effects.  Asthma-no recent issues.  Hasn't had to use inhaler in a while.  Not using Symbicort or albuterol currently.  Vitamin D deficiency-compliant vitamin D supplement daily  No other aggravating or relieving factors.    No other c/o.  Past Medical History:  Diagnosis Date   Asthma    Elevated LFTs    Fatty liver 06/05/2018   Former smoker    Hyperlipidemia    Hypogonadism in male 07/16/2019   Low serum testosterone level 05/31/2019   Vitamin D deficiency 05/31/2019   Current Outpatient Medications on File Prior to Visit  Medication Sig Dispense Refill   albuterol (VENTOLIN HFA) 108 (90 Base) MCG/ACT inhaler INHALE 2 PUFFS INTO THE LUNGS EVERY 6 HOURS AS NEEDED FOR WHEEZING OR SHORTNESS OF BREATH 8.5 g 0   Cholecalciferol (VITAMIN D PO) Take 1 tablet by mouth daily.     Multiple Vitamin (MULTIVITAMIN WITH MINERALS) TABS tablet Take 1 tablet by mouth daily.     rosuvastatin (CRESTOR) 20 MG tablet Take 1 tablet (20 mg total) by mouth daily. 90 tablet 3   sildenafil (REVATIO) 20 MG tablet Take 1 tablet (20 mg total) by mouth daily as needed. 30 tablet 3   SYMBICORT 160-4.5 MCG/ACT inhaler INHALE 2 PUFFS INTO THE LUNGS TWICE DAILY (Patient not taking: Reported on 08/19/2022) 10.2 g 1   No current facility-administered medications on file prior to visit.    The following portions of the patient's history were reviewed and updated as appropriate: allergies, current medications, past family history, past medical history, past social history, past  surgical history and problem list.  ROS Otherwise as in subjective above    Objective: BP 128/76   Pulse 66   Wt 216 lb 6.4 oz (98.2 kg)   BMI 30.18 kg/m   Wt Readings from Last 3 Encounters:  08/19/22 216 lb 6.4 oz (98.2 kg)  12/09/21 229 lb 3.2 oz (104 kg)  09/15/21 234 lb 9.6 oz (106.4 kg)   General appearance: alert, no distress, well developed, well nourished     Assessment: Encounter Diagnoses  Name Primary?   Hypogonadism in male Yes   Low serum testosterone level    Moderate persistent asthma without complication    Vitamin D deficiency    OSA (obstructive sleep apnea)    Erectile dysfunction, unspecified erectile dysfunction type    Mixed hyperlipidemia    High risk medication use      Plan: Hypogonadism, low testosterone-updated labs today.  We discussed risk and benefits of medication.  Discussed different options for medication including weekly injectable subcutaneous, IM muscular injection, oral testosterone, topical testosterone, other.  He may be interested in subcutaneous injectable going forward but for now he will stick with topical testosterone.  Asthma-no recent concerns.  Not currently on any inhaler  Vitamin D deficiency-continue supplement  Hyperlipidemia -continue statin, continue yearly follow-up for labs   David Kent was seen today for medical management of chronic issues.  Diagnoses and all orders for this visit:  Hypogonadism in male -     Testosterone -     CBC -     Testosterone 1.62 % GEL; Apply 4 Pump topically daily.  Low serum testosterone level  Moderate persistent asthma without complication  Vitamin D deficiency  OSA (obstructive sleep apnea)  Erectile dysfunction, unspecified erectile dysfunction type  Mixed hyperlipidemia  High risk medication use -     Testosterone -     CBC    Follow up: pending labs

## 2022-08-20 NOTE — Progress Notes (Signed)
Results sent through MyChart

## 2022-11-29 ENCOUNTER — Encounter: Payer: Self-pay | Admitting: Medical

## 2022-11-29 ENCOUNTER — Ambulatory Visit: Payer: Managed Care, Other (non HMO) | Admitting: Medical

## 2022-11-29 VITALS — BP 122/78 | HR 69 | Wt 218.6 lb

## 2022-11-29 DIAGNOSIS — D751 Secondary polycythemia: Secondary | ICD-10-CM

## 2022-11-29 DIAGNOSIS — Z125 Encounter for screening for malignant neoplasm of prostate: Secondary | ICD-10-CM | POA: Diagnosis not present

## 2022-11-29 DIAGNOSIS — E782 Mixed hyperlipidemia: Secondary | ICD-10-CM | POA: Diagnosis not present

## 2022-11-29 DIAGNOSIS — E291 Testicular hypofunction: Secondary | ICD-10-CM | POA: Diagnosis not present

## 2022-11-29 DIAGNOSIS — Z23 Encounter for immunization: Secondary | ICD-10-CM | POA: Diagnosis not present

## 2022-11-29 NOTE — Progress Notes (Signed)
Subjective:  David Kent is a 47 y.o. male who presents for Chief Complaint  Patient presents with   Medication Management    Flu shot.      Here for med check.  Hyperlipidemia-compliant with Crestor 20 mg daily without complaint.  He misses a dose every now and then but for the most part compliant  Hypogonadism, low testosterone-currently on AndroGel 2 pumps each side or 4 pumps total daily.  He does 2 pumps on the arms and 2 pumps on the legs.  He feels like this works pretty well for him.  Exercises regular.  He had a colonoscopy earlier this year.  He will be due repeat in 2 years.  Sees Dr. Elnoria Howard.  Asthma-no recent problems.  Has not used Symbicort in a couple years.  Otherwise normal state of health  No other aggravating or relieving factors.    No other c/o.  Past Medical History:  Diagnosis Date   Asthma    Elevated LFTs    Fatty liver 06/05/2018   Former smoker    Hyperlipidemia    Hypogonadism in male 07/16/2019   Low serum testosterone level 05/31/2019   Vitamin D deficiency 05/31/2019   Current Outpatient Medications on File Prior to Visit  Medication Sig Dispense Refill   Cholecalciferol (VITAMIN D PO) Take 1 tablet by mouth daily.     Multiple Vitamin (MULTIVITAMIN WITH MINERALS) TABS tablet Take 1 tablet by mouth daily.     rosuvastatin (CRESTOR) 20 MG tablet Take 1 tablet (20 mg total) by mouth daily. 90 tablet 3   Testosterone 1.62 % GEL Apply 4 Pump topically daily. 150 g 5   albuterol (VENTOLIN HFA) 108 (90 Base) MCG/ACT inhaler INHALE 2 PUFFS INTO THE LUNGS EVERY 6 HOURS AS NEEDED FOR WHEEZING OR SHORTNESS OF BREATH (Patient not taking: Reported on 11/29/2022) 8.5 g 0   sildenafil (REVATIO) 20 MG tablet Take 1 tablet (20 mg total) by mouth daily as needed. (Patient not taking: Reported on 11/29/2022) 30 tablet 3   SYMBICORT 160-4.5 MCG/ACT inhaler INHALE 2 PUFFS INTO THE LUNGS TWICE DAILY (Patient not taking: Reported on 08/19/2022) 10.2 g 1   No  current facility-administered medications on file prior to visit.     The following portions of the patient's history were reviewed and updated as appropriate: allergies, current medications, past family history, past medical history, past social history, past surgical history and problem list.  ROS Otherwise as in subjective above  Objective: BP 122/78   Pulse 69   Wt 218 lb 9.6 oz (99.2 kg)   SpO2 96%   BMI 30.49 kg/m   General appearance: alert, no distress, well developed, well nourished Neck: supple, no lymphadenopathy, no thyromegaly, no masses Heart: RRR, normal S1, S2, no murmurs Lungs: CTA bilaterally, no wheezes, rhonchi, or rales Pulses: 2+ radial pulses, 2+ pedal pulses, normal cap refill Ext: no edema   Assessment: Encounter Diagnoses  Name Primary?   Mixed hyperlipidemia Yes   Erythrocytosis    Hypogonadism in male    Screening for prostate cancer    Need for influenza vaccination      Plan: Hyperlipidemia - continue crestor 20mg  daily.  Labs today  Erthrocytosis - stable, due to testosterone therapy  Hypogonadism - does fine on current therapy.  Discussed other options but he is content with topical gel as he is doing already  PSA screening today  Counseled on the influenza virus vaccine.  Vaccine information sheet given.  Influenza vaccine given after  consent obtained.   Lorene was seen today for medication management.  Diagnoses and all orders for this visit:  Mixed hyperlipidemia -     Lipid panel -     Comprehensive metabolic panel  Erythrocytosis  Hypogonadism in male -     PSA  Screening for prostate cancer -     PSA  Need for influenza vaccination    Follow up: pending labs

## 2022-11-30 ENCOUNTER — Other Ambulatory Visit: Payer: Self-pay | Admitting: Medical

## 2022-11-30 DIAGNOSIS — N529 Male erectile dysfunction, unspecified: Secondary | ICD-10-CM

## 2022-11-30 DIAGNOSIS — J4540 Moderate persistent asthma, uncomplicated: Secondary | ICD-10-CM

## 2022-11-30 LAB — PSA: Prostate Specific Ag, Serum: 0.8 ng/mL (ref 0.0–4.0)

## 2022-11-30 LAB — COMPREHENSIVE METABOLIC PANEL
ALT: 27 [IU]/L (ref 0–44)
AST: 23 [IU]/L (ref 0–40)
Albumin: 4.7 g/dL (ref 4.1–5.1)
Alkaline Phosphatase: 80 [IU]/L (ref 44–121)
BUN/Creatinine Ratio: 9 (ref 9–20)
BUN: 11 mg/dL (ref 6–24)
Bilirubin Total: 1.3 mg/dL — ABNORMAL HIGH (ref 0.0–1.2)
CO2: 23 mmol/L (ref 20–29)
Calcium: 9.3 mg/dL (ref 8.7–10.2)
Chloride: 101 mmol/L (ref 96–106)
Creatinine, Ser: 1.23 mg/dL (ref 0.76–1.27)
Globulin, Total: 2.4 g/dL (ref 1.5–4.5)
Glucose: 85 mg/dL (ref 70–99)
Potassium: 4.1 mmol/L (ref 3.5–5.2)
Sodium: 140 mmol/L (ref 134–144)
Total Protein: 7.1 g/dL (ref 6.0–8.5)
eGFR: 73 mL/min/{1.73_m2} (ref >59)

## 2022-11-30 LAB — LIPID PANEL
Chol/HDL Ratio: 4.6 ratio (ref 0.0–5.0)
Cholesterol, Total: 175 mg/dL (ref 100–199)
HDL: 38 mg/dL — ABNORMAL LOW (ref >39)
LDL Chol Calc (NIH): 115 mg/dL — ABNORMAL HIGH (ref 0–99)
Triglycerides: 123 mg/dL (ref 0–149)
VLDL Cholesterol Cal: 22 mg/dL (ref 5–40)

## 2022-11-30 MED ORDER — ALBUTEROL SULFATE HFA 108 (90 BASE) MCG/ACT IN AERS: 2.0000 | INHALATION_SPRAY | Freq: Four times a day (QID) | RESPIRATORY_TRACT | 0 refills | Status: DC | PRN

## 2022-11-30 MED ORDER — SILDENAFIL CITRATE 20 MG PO TABS: 20.0000 mg | ORAL_TABLET | Freq: Every day | ORAL | 3 refills | Status: DC | PRN

## 2022-11-30 MED ORDER — ROSUVASTATIN CALCIUM 20 MG PO TABS: 20.0000 mg | ORAL_TABLET | Freq: Every day | ORAL | 3 refills | Status: DC

## 2022-11-30 NOTE — Progress Notes (Signed)
Results sent through MyChart

## 2022-12-15 ENCOUNTER — Encounter: Payer: Managed Care, Other (non HMO) | Admitting: Medical

## 2022-12-21 ENCOUNTER — Other Ambulatory Visit: Payer: Self-pay | Admitting: Medical

## 2023-03-01 ENCOUNTER — Other Ambulatory Visit: Payer: Self-pay | Admitting: Medical

## 2023-03-01 DIAGNOSIS — E291 Testicular hypofunction: Secondary | ICD-10-CM

## 2023-07-15 ENCOUNTER — Encounter: Payer: Managed Care, Other (non HMO) | Admitting: Medical

## 2023-07-15 DIAGNOSIS — Z0000 Encounter for general adult medical examination without abnormal findings: Secondary | ICD-10-CM

## 2023-07-25 ENCOUNTER — Other Ambulatory Visit: Payer: Self-pay | Admitting: Medical

## 2023-07-25 DIAGNOSIS — E291 Testicular hypofunction: Secondary | ICD-10-CM

## 2023-07-26 ENCOUNTER — Other Ambulatory Visit: Payer: Self-pay | Admitting: Medical

## 2023-07-26 DIAGNOSIS — E291 Testicular hypofunction: Secondary | ICD-10-CM

## 2023-07-26 MED ORDER — TESTOSTERONE 1.62 % TD GEL: 4.0000 | Freq: Every day | TRANSDERMAL | 0 refills | Status: DC

## 2023-07-26 NOTE — Telephone Encounter (Unsigned)
 Copied from CRM [id number]. Topic: Clinical - Medication Refill >> Jul 26, 2023 11:43 AM Fonda T wrote: Medication: Testosterone  1.62 % GEL   Has the patient contacted their pharmacy? Yes, per pharmacy stated medication had to be approved by provider (Agent: If no, request that the patient contact the pharmacy for the refill. If patient does not wish to contact the pharmacy document the reason why and proceed with request.) (Agent: If yes, when and what did the pharmacy advise?)  This is the patient's preferred pharmacy:  [id number] [place], [place] - [address] AT [facility] [address] [facility] [address] [place] [address] Phone: [phone] Fax: [phone]   Is this the correct pharmacy for this prescription? Yes If no, delete pharmacy and type the correct one.   Has the prescription been filled recently? Yes  Is the patient out of the medication? Yes, almost out  Has the patient been seen for an appointment in the last year OR does the patient have an upcoming appointment? Yes  Can we respond through MyChart? Yes  Agent: Please be advised that Rx refills may take up to 3 business days. We ask that you follow-up with your pharmacy.

## 2023-08-02 ENCOUNTER — Encounter: Payer: Self-pay | Admitting: Medical

## 2023-08-10 ENCOUNTER — Other Ambulatory Visit (HOSPITAL_COMMUNITY): Payer: Self-pay

## 2023-08-10 ENCOUNTER — Ambulatory Visit (INDEPENDENT_AMBULATORY_CARE_PROVIDER_SITE_OTHER): Payer: Self-pay | Admitting: Medical

## 2023-08-10 ENCOUNTER — Telehealth: Payer: Self-pay

## 2023-08-10 VITALS — BP 120/70 | HR 75 | Wt 222.6 lb

## 2023-08-10 DIAGNOSIS — R7989 Other specified abnormal findings of blood chemistry: Secondary | ICD-10-CM

## 2023-08-10 DIAGNOSIS — E782 Mixed hyperlipidemia: Secondary | ICD-10-CM | POA: Diagnosis not present

## 2023-08-10 DIAGNOSIS — E291 Testicular hypofunction: Secondary | ICD-10-CM

## 2023-08-10 MED ORDER — TESTOSTERONE 1.62 % TD GEL: 4.0000 | Freq: Every day | TRANSDERMAL | 1 refills | Status: DC

## 2023-08-10 NOTE — Progress Notes (Signed)
 Subjective: Chief Complaint  Patient presents with   Medical Management of Chronic Issues    Med check, would like cholesterol checked, been off medication for a couple months. Had some creamer in coffee this morning   Here for med management  Hx/o elevated cholesterol and family history of high cholesterol.  Stopped cholesterol medicaiton for  while to see if he could manage lipids with diet and exercise.  Been off cholesterol medication several months, 6 months probably.   Getting exercise regularly.  Diet is not too bad.  Eats meat, but not a lot of junk or cheese.    Here for recheck on testosterone .  Using 4 pumps total daily .   Feels ok in general, good energy.  No decreased libido.    Hx/o low vitamin D .  Takes a multivitamin regularly  Asthma - no regularly problems, had a little asthma flare months ago.  Uses inhaler prn.   No other aggravating or relieving factors. No other complaint.   Past Medical History:  Diagnosis Date   Asthma    Elevated LFTs    Fatty liver 06/05/2018   Former smoker    Hyperlipidemia    Hypogonadism in male 07/16/2019   Low serum testosterone  level 05/31/2019   Vitamin D  deficiency 05/31/2019   Current Outpatient Medications on File Prior to Visit  Medication Sig Dispense Refill   albuterol  (VENTOLIN  HFA) 108 (90 Base) MCG/ACT inhaler Inhale 2 puffs into the lungs every 6 (six) hours as needed for wheezing or shortness of breath. 8.5 g 0   Cholecalciferol (VITAMIN D  PO) Take 1 tablet by mouth daily.     Multiple Vitamin (MULTIVITAMIN WITH MINERALS) TABS tablet Take 1 tablet by mouth daily.     sildenafil  (REVATIO ) 20 MG tablet Take 1 tablet (20 mg total) by mouth daily as needed. 30 tablet 3   rosuvastatin  (CRESTOR ) 20 MG tablet TAKE 1 TABLET(20 MG) BY MOUTH DAILY (Patient not taking: Reported on 08/10/2023) 90 tablet 1   No current facility-administered medications on file prior to visit.   ROS as in subjective   Objective: BP 120/70    Pulse 75   Wt 222 lb 9.6 oz (101 kg)   BMI 31.05 kg/m   Wt Readings from Last 3 Encounters:  08/10/23 222 lb 9.6 oz (101 kg)  11/29/22 218 lb 9.6 oz (99.2 kg)  08/19/22 216 lb 6.4 oz (98.2 kg)    General appearence: alert, no distress, WD/WN,  Heart: RRR, normal S1, S2, no murmurs Lungs: CTA bilaterally, no wheezes, rhonchi, or rales    Assessment: Encounter Diagnoses  Name Primary?   Mixed hyperlipidemia Yes   Low serum testosterone  level    Hypogonadism in male       Plan: Mixed hyperlipidemia-he wants to check labs today fasting based on lifestyle changes.  He has not been taking his statin in months.  Continue working on exercise 4 to 5 days/week and healthy low-cholesterol diet  Low testosterone -compliant with testosterone  gel but he has been out of it for the last week so we will hold off on labs today.  Discussed that we need to do labs when he has been on it regularly for at least a month.  We also discussed that he should have testosterone  labs every 6 months  Discussed safety with testosterone  gel, avoid letting others posterior for at least 2 hours after application  Alvie was seen today for medical management of chronic issues.  Diagnoses and all orders for this  visit:  Mixed hyperlipidemia -     Lipid panel  Low serum testosterone  level  Hypogonadism in male -     Testosterone  1.62 % GEL; Apply 4 Pump topically daily.    F/u pending labs, September as scheduled for physical.

## 2023-08-10 NOTE — Telephone Encounter (Signed)
 Pharmacy Patient Advocate Encounter   Received notification from CoverMyMeds that prior authorization for Testosterone  20.25 MG/ACT(1.62%) gel is required/requested.   Insurance verification completed.   The patient is insured through [facility] .   Per test claim: PA required; PA submitted to above mentioned insurance via CoverMyMeds Key/confirmation #/EOC (Key: [id number]) Status is pending

## 2023-08-11 ENCOUNTER — Ambulatory Visit: Payer: Self-pay | Admitting: Medical

## 2023-08-11 ENCOUNTER — Other Ambulatory Visit (HOSPITAL_COMMUNITY): Payer: Self-pay

## 2023-08-11 LAB — LIPID PANEL
Chol/HDL Ratio: 8.6 ratio — ABNORMAL HIGH (ref 0.0–5.0)
Cholesterol, Total: 309 mg/dL — ABNORMAL HIGH (ref 100–199)
HDL: 36 mg/dL — ABNORMAL LOW (ref >39)
LDL Chol Calc (NIH): 207 mg/dL — ABNORMAL HIGH (ref 0–99)
Triglycerides: 323 mg/dL — ABNORMAL HIGH (ref 0–149)
VLDL Cholesterol Cal: 66 mg/dL — ABNORMAL HIGH (ref 5–40)

## 2023-08-11 NOTE — Progress Notes (Signed)
 Results sent through MyChart

## 2023-08-11 NOTE — Telephone Encounter (Signed)
 Pharmacy Patient Advocate Encounter  Received notification from [facility] that Prior Authorization for Testosterone  20.25 MG/ACT(1.62%) gel has been APPROVED from 7.23.25 to 7.23.26. Ran test claim, Copay is $RTS, RX WAS FILLED ON TODAY 08/11/23. This test claim was processed through [facility]- copay amounts may vary at other pharmacies due to pharmacy/plan contracts, or as the patient moves through the different stages of their insurance plan.   PA #/Case ID/Reference #:  ([id number])

## 2023-10-12 ENCOUNTER — Ambulatory Visit: Payer: Self-pay | Admitting: Medical

## 2023-10-12 ENCOUNTER — Encounter: Payer: Self-pay | Admitting: Medical

## 2023-10-12 VITALS — BP 124/82 | HR 78 | Ht 70.0 in | Wt 222.0 lb

## 2023-10-12 DIAGNOSIS — G4733 Obstructive sleep apnea (adult) (pediatric): Secondary | ICD-10-CM

## 2023-10-12 DIAGNOSIS — Z23 Encounter for immunization: Secondary | ICD-10-CM | POA: Diagnosis not present

## 2023-10-12 DIAGNOSIS — Z1389 Encounter for screening for other disorder: Secondary | ICD-10-CM

## 2023-10-12 DIAGNOSIS — Z125 Encounter for screening for malignant neoplasm of prostate: Secondary | ICD-10-CM | POA: Diagnosis not present

## 2023-10-12 DIAGNOSIS — Z7185 Encounter for immunization safety counseling: Secondary | ICD-10-CM

## 2023-10-12 DIAGNOSIS — Z0000 Encounter for general adult medical examination without abnormal findings: Secondary | ICD-10-CM | POA: Diagnosis not present

## 2023-10-12 DIAGNOSIS — E559 Vitamin D deficiency, unspecified: Secondary | ICD-10-CM

## 2023-10-12 DIAGNOSIS — E782 Mixed hyperlipidemia: Secondary | ICD-10-CM

## 2023-10-12 DIAGNOSIS — E291 Testicular hypofunction: Secondary | ICD-10-CM

## 2023-10-12 DIAGNOSIS — R7989 Other specified abnormal findings of blood chemistry: Secondary | ICD-10-CM

## 2023-10-12 NOTE — Progress Notes (Signed)
 Name: David Kent   Date of Visit: 10/12/23   Date of last visit with me: 08/10/2023   CHIEF COMPLAINT:  Chief Complaint  Patient presents with   Annual Exam    Physical        HPI:  Discussed the use of AI scribe software for clinical note transcription with the patient, who gave verbal consent to proceed.  History of Present Illness   David Kent is a 48 year old male who presents for an annual physical exam.  He has been taking his cholesterol medication as prescribed and uses a multivitamin that includes vitamin D , though he is unsure of the exact dosage. He applies testosterone  gel, using four pumps daily to his arms and legs.  He rarely uses his albuterol  inhaler, estimating that he may have used it once this year. He does not use a daily prevention inhaler. He has a history of asthma.  He owns a CPAP machine but does not use it regularly. He attributes a reduction in snoring to recent weight loss.  He has undergone routine cancer screenings, including a colonoscopy last year and a PSA blood test in November.      Reviewed their medical, surgical, family, social, medication, and allergy history and updated chart as appropriate.  No Known Allergies  Past Medical History:  Diagnosis Date   Asthma    Elevated LFTs    Fatty liver 06/05/2018   Former smoker    Hyperlipidemia    Hypogonadism in male 07/16/2019   Low serum testosterone  level 05/31/2019   Vitamin D  deficiency 05/31/2019     Current Outpatient Medications:    albuterol  (VENTOLIN  HFA) 108 (90 Base) MCG/ACT inhaler, Inhale 2 puffs into the lungs every 6 (six) hours as needed for wheezing or shortness of breath., Disp: 8.5 g, Rfl: 0   Cholecalciferol (VITAMIN D  PO), Take 1 tablet by mouth daily., Disp: , Rfl:    Multiple Vitamin (MULTIVITAMIN WITH MINERALS) TABS tablet, Take 1 tablet by mouth daily., Disp: , Rfl:    rosuvastatin  (CRESTOR ) 20 MG tablet, TAKE 1 TABLET(20 MG) BY MOUTH DAILY, Disp: 90  tablet, Rfl: 1   sildenafil  (REVATIO ) 20 MG tablet, Take 1 tablet (20 mg total) by mouth daily as needed., Disp: 30 tablet, Rfl: 3   Testosterone  1.62 % GEL, Apply 4 Pump topically daily., Disp: 150 g, Rfl: 1  Family History  Problem Relation Age of Onset   Hyperlipidemia Mother    Heart disease Father 57       bypass   Hyperlipidemia Father    Cancer Maternal Grandmother        pancreas?   Lung cancer Maternal Grandfather     Past Surgical History:  Procedure Laterality Date   LAPAROSCOPIC APPENDECTOMY N/A 09/01/2017   Procedure: APPENDECTOMY LAPAROSCOPIC;  Surgeon: Belinda Cough, MD;  Location: [facility] OR;  Service: General;  Laterality: N/A;   TENDON REPAIR  2021   right quadriceps tendon repair after fall     Review of Systems  Constitutional:  Negative for chills, fever, malaise/fatigue and weight loss.  HENT:  Negative for congestion, ear pain, hearing loss, sore throat and tinnitus.   Eyes:  Negative for blurred vision, pain and redness.  Respiratory:  Negative for cough, hemoptysis and shortness of breath.   Cardiovascular:  Negative for chest pain, palpitations, orthopnea, claudication and leg swelling.  Gastrointestinal:  Negative for abdominal pain, blood in stool, constipation, diarrhea, nausea and vomiting.  Genitourinary:  Negative for dysuria,  flank pain, frequency, hematuria and urgency.  Musculoskeletal:  Negative for falls, joint pain and myalgias.  Skin:  Negative for itching and rash.  Neurological:  Negative for dizziness, tingling, speech change, weakness and headaches.  Endo/Heme/Allergies:  Negative for polydipsia. Does not bruise/bleed easily.  Psychiatric/Behavioral:  Negative for depression and memory loss. The patient is not nervous/anxious and does not have insomnia.      OBJECTIVE:    BP 124/82   Pulse 78   Ht 5' 10 (1.778 m)   Wt 222 lb (100.7 kg)   SpO2 97%   BMI 31.85 kg/m   BP Readings from Last 3 Encounters:  10/12/23 124/82   08/10/23 120/70  11/29/22 122/78    Wt Readings from Last 3 Encounters:  10/12/23 222 lb (100.7 kg)  08/10/23 222 lb 9.6 oz (101 kg)  11/29/22 218 lb 9.6 oz (99.2 kg)    Physical Exam   General appearance: alert, no distress, WD/WN, Caucasian male Skin: scattered macules, no worrisome lesions, tattoo left posterior shoulder HEENT: normocephalic, conjunctiva/corneas normal, sclerae anicteric, PERRLA, EOMi, nares patent, no discharge or erythema, pharynx normal Oral cavity: MMM, tongue normal, teeth normal Neck: supple, no lymphadenopathy, no thyromegaly, no masses, normal ROM, no bruits Chest: non tender, normal shape and expansion Heart: RRR, normal S1, S2, no murmurs Lungs: CTA bilaterally, no wheezes, rhonchi, or rales Abdomen: +bs, soft, non tender, non distended, no masses, no hepatomegaly, no splenomegaly, no bruits Back: non tender, normal ROM, no scoliosis Musculoskeletal: upper extremities non tender, no obvious deformity, normal ROM throughout, lower extremities non tender, no obvious deformity, normal ROM throughout Extremities: no edema, no cyanosis, no clubbing Pulses: 2+ symmetric, upper and lower extremities, normal cap refill Neurological: alert, oriented x 3, CN2-12 intact, strength normal upper extremities and lower extremities, sensation normal throughout, DTRs 2+ throughout, no cerebellar signs, gait normal Psychiatric: normal affect, behavior normal, pleasant  GU: declined Rectal: declined   ASSESSMENT/PLAN:   Encounter Diagnoses  Name Primary?   Encounter for health maintenance examination in adult Yes   Vaccine counseling    Screening for hematuria or proteinuria    Screening for prostate cancer    Mixed hyperlipidemia    Hypogonadism in male    Vitamin D  deficiency    Low serum testosterone  level    Needs flu shot    OSA (obstructive sleep apnea)    Need for pneumococcal 20-valent conjugate vaccination      Separate significant issues  discussed: Obstructive sleep apnea -advised weight loss -consider repeat trial of Bipap.   May need pulmonology consult.    Hyperlipidemia On cholesterol medication with previously elevated levels.  Continue Crestor  20mg  daily  Testosterone  deficiency Using testosterone  gel, four pumps daily. Consistent use for two months after previous lapse. - Order testosterone  level as part of routine labs.  Asthma Rare use of albuterol  inhaler over the past year.     Vit D deficiency - continue supplement   This visit was a preventative care visit, also known as wellness visit or routine physical.   Topics typically include healthy lifestyle, diet, exercise, preventative care, vaccinations, sick and well care, proper use of emergency dept and after hours care, as well as other concerns.     General Recommendations: Continue to return yearly for your annual wellness and preventative care visits.  This gives us  a chance to discuss healthy lifestyle, exercise, vaccinations, review your chart record, and perform screenings where appropriate.  I recommend you see your eye doctor yearly  for routine vision care.  I recommend you see your dentist yearly for routine dental care including hygiene visits twice yearly.   Vaccination  Immunization History  Administered Date(s) Administered   Influenza, Seasonal, Injecte, Preservative Fre 11/29/2022   Influenza,inj,Quad PF,6+ Mos 09/13/2018, 11/30/2019, 11/28/2020, 12/23/2021   PFIZER(Purple Top)SARS-COV-2 Vaccination 04/12/2019, 05/07/2019, 11/20/2019   Pfizer Covid-19 Vaccine Bivalent Booster 34yrs & up 11/28/2020   Pneumococcal Polysaccharide-23 12/23/2021   Tdap 09/13/2018    Counseled on the influenza virus vaccine.  Vaccine information sheet given.  Influenza vaccine given after consent obtained.  Counseled on the pneumococcal vaccine.  Vaccine information sheet given.  Pneumococcal vaccine Prevnar 20 given after consent  obtained.   Screening for cancer: Colon cancer screening: 7/24 colonoscopy reviewed, recheck 3 years.   Dr. Belvie Just  Prostate Cancer screening: The recommended prostate cancer screening test is a blood test called the prostate-specific antigen (PSA) test. PSA is a protein that is made in the prostate. As you age, your prostate naturally produces more PSA. Abnormally high PSA levels may be caused by: Prostate cancer. An enlarged prostate that is not caused by cancer (benign prostatic hyperplasia, or BPH). This condition is very common in older men. A prostate gland infection (prostatitis) or urinary tract infection. Certain medicines such as male hormones (like testosterone ) or other medicines that raise testosterone  levels. A rectal exam may be done as part of prostate cancer screening to help provide information about the size of your prostate gland. When a rectal exam is performed, it should be done after the PSA level is drawn to avoid any effect on the results.   Skin cancer screening: Check your skin regularly for new changes, growing lesions, or other lesions of concern Come in for evaluation if you have skin lesions of concern.   Lung cancer screening: If you have a greater than 20 pack year history of tobacco use, then you may qualify for lung cancer screening with a chest CT scan.   Please call your insurance company to inquire about coverage for this test.   Pancreatic cancer:  no current screening test is available or routinely recommended. (risk factors: smoking, overweight or obese, diabetes, chronic pancreatitis, work exposure - dry cleaning, metal working, 48yo>, M>F, Tree surgeon, family hx/o, hereditary breast, ovarian, melanoma, lynch, peutz-jeghers).  Symptoms: jaundice, dark urine, light color or greasy stools, itchy skin, belly or back pain, weight loss, poor appetite, nausea, vomiting, liver enlargement, DVT/blood clots.   We currently don't have screenings  for other cancers besides breast, cervical, colon, and lung cancers.  If you have a strong family history of cancer or have other cancer screening concerns, please let me know.  Genetic testing referral is an option for individuals with high cancer risk in the family.  There are some other cancer screenings in development currently.   Bone health: Get at least 150 minutes of aerobic exercise weekly Get weight bearing exercise at least once weekly Bone density test:  A bone density test is an imaging test that uses a type of X-ray to measure the amount of calcium  and other minerals in your bones. The test may be used to diagnose or screen you for a condition that causes weak or thin bones (osteoporosis), predict your risk for a broken bone (fracture), or determine how well your osteoporosis treatment is working. The bone density test is recommended for females 65 and older, or females or males <65 if certain risk factors such as thyroid disease, long term use  of steroids such as for asthma or rheumatological issues, vitamin D  deficiency, estrogen deficiency, family history of osteoporosis, self or family history of fragility fracture in first degree relative.    Heart health: Get at least 150 minutes of aerobic exercise weekly Limit alcohol It is important to maintain a healthy blood pressure and healthy cholesterol numbers  Heart disease screening: Screening for heart disease includes screening for blood pressure, fasting lipids, glucose/diabetes screening, BMI height to weight ratio, reviewed of smoking status, physical activity, and diet.    Goals include blood pressure 120/80 or less, maintaining a healthy lipid/cholesterol profile, preventing diabetes or keeping diabetes numbers under good control, not smoking or using tobacco products, exercising most days per week or at least 150 minutes per week of exercise, and eating healthy variety of fruits and vegetables, healthy oils, and avoiding  unhealthy food choices like fried food, fast food, high sugar and high cholesterol foods.    Other tests may possibly include EKG test, CT coronary calcium  score, echocardiogram, exercise treadmill stress test.      Vascular disease screening: For higher risk individuals including smokers, diabetics, patients with known heart disease or high blood pressure, kidney disease, and others, screening for vascular disease or atherosclerosis of the arteries is available.  Examples may include carotid ultrasound, abdominal aortic ultrasound, ABI blood flow screening in the legs, thoracic aorta screening.   Medical care options: I recommend you continue to seek care here first for routine care.  We try really hard to have available appointments Monday through Friday daytime hours for sick visits, acute visits, and physicals.  Urgent care should be used for after hours and weekends for significant issues that cannot wait till the next day.  The emergency department should be used for significant potentially life-threatening emergencies.  The emergency department is expensive, can often have long wait times for less significant concerns, so try to utilize primary care, urgent care, or telemedicine when possible to avoid unnecessary trips to the emergency department.  Virtual visits and telemedicine have been introduced since the pandemic started in 2020, and can be convenient ways to receive medical care.  We offer virtual appointments as well to assist you in a variety of options to seek medical care.   Legal  Take the time to do a last will and testament, Advanced Directives including Health Care Power of Attorney and Living Will documents.  Don't leave your family with burdens that can be handled ahead of time.   Advanced Directives: I recommend you consider completing a Health Care Power of Attorney and Living Will.   These documents respect your wishes and help alleviate burdens on your loved ones if you  were to become terminally ill or be in a position to need those documents enforced.    You can complete Advanced Directives yourself, have them notarized, then have copies made for our office, for you and for anybody you feel should have them in safe keeping.  Or, you can have an attorney prepare these documents.   If you haven't updated your Last Will and Testament in a while, it may be worthwhile having an attorney prepare these documents together and save on some costs.       Biran was seen today for annual exam.  Diagnoses and all orders for this visit:  Encounter for health maintenance examination in adult -     CBC -     Comprehensive metabolic panel with GFR -     Testosterone  -  VITAMIN D  25 Hydroxy (Vit-D Deficiency, Fractures) -     PSA  Vaccine counseling  Screening for hematuria or proteinuria  Screening for prostate cancer -     PSA  Mixed hyperlipidemia  Hypogonadism in male -     Testosterone   Vitamin D  deficiency -     VITAMIN D  25 Hydroxy (Vit-D Deficiency, Fractures)  Low serum testosterone  level  Needs flu shot  OSA (obstructive sleep apnea)  Need for pneumococcal 20-valent conjugate vaccination    I recommend follow up yearly for a routine physical.   [facility]

## 2023-10-13 LAB — COMPREHENSIVE METABOLIC PANEL WITH GFR
ALT: 48 IU/L — AB (ref 0–44)
AST: 28 IU/L (ref 0–40)
Albumin: 4.6 g/dL (ref 4.1–5.1)
Alkaline Phosphatase: 74 IU/L (ref 47–123)
BUN/Creatinine Ratio: 11 (ref 9–20)
BUN: 12 mg/dL (ref 6–24)
Bilirubin Total: 1.6 mg/dL — AB (ref 0.0–1.2)
CO2: 20 mmol/L (ref 20–29)
Calcium: 9.9 mg/dL (ref 8.7–10.2)
Chloride: 101 mmol/L (ref 96–106)
Creatinine, Ser: 1.1 mg/dL (ref 0.76–1.27)
Globulin, Total: 2.3 g/dL (ref 1.5–4.5)
Glucose: 82 mg/dL (ref 70–99)
Potassium: 4.3 mmol/L (ref 3.5–5.2)
Sodium: 138 mmol/L (ref 134–144)
Total Protein: 6.9 g/dL (ref 6.0–8.5)
eGFR: 83 mL/min/1.73 (ref >59)

## 2023-10-13 LAB — CBC
Hematocrit: 54.4 % — ABNORMAL HIGH (ref 37.5–51.0)
Hemoglobin: 18.9 g/dL — ABNORMAL HIGH (ref 13.0–17.7)
MCH: 33.2 pg — ABNORMAL HIGH (ref 26.6–33.0)
MCHC: 34.7 g/dL (ref 31.5–35.7)
MCV: 96 fL (ref 79–97)
Platelets: 310 x10E3/uL (ref 150–450)
RBC: 5.69 x10E6/uL (ref 4.14–5.80)
RDW: 13.2 % (ref 11.6–15.4)
WBC: 6.6 x10E3/uL (ref 3.4–10.8)

## 2023-10-13 LAB — VITAMIN D 25 HYDROXY (VIT D DEFICIENCY, FRACTURES): Vit D, 25-Hydroxy: 18.1 ng/mL — AB (ref 30.0–100.0)

## 2023-10-13 LAB — PSA: Prostate Specific Ag, Serum: 0.9 ng/mL (ref 0.0–4.0)

## 2023-10-13 LAB — TESTOSTERONE: Testosterone: 489 ng/dL (ref 264–916)

## 2023-10-17 ENCOUNTER — Ambulatory Visit: Payer: Self-pay | Admitting: Medical

## 2023-10-17 ENCOUNTER — Other Ambulatory Visit: Payer: Self-pay | Admitting: Medical

## 2023-10-17 DIAGNOSIS — E291 Testicular hypofunction: Secondary | ICD-10-CM

## 2023-10-17 DIAGNOSIS — N529 Male erectile dysfunction, unspecified: Secondary | ICD-10-CM

## 2023-10-17 DIAGNOSIS — J4540 Moderate persistent asthma, uncomplicated: Secondary | ICD-10-CM

## 2023-10-17 DIAGNOSIS — R7989 Other specified abnormal findings of blood chemistry: Secondary | ICD-10-CM

## 2023-10-17 MED ORDER — SILDENAFIL CITRATE 20 MG PO TABS: 20.0000 mg | ORAL_TABLET | Freq: Every day | ORAL | 3 refills | Status: AC | PRN

## 2023-10-17 MED ORDER — VITAMIN D (ERGOCALCIFEROL) 1.25 MG (50000 UNIT) PO CAPS: 50000.0000 [IU] | ORAL_CAPSULE | ORAL | 3 refills | Status: AC

## 2023-10-17 MED ORDER — TESTOSTERONE 1.62 % TD GEL: 3.0000 | Freq: Every day | TRANSDERMAL | 4 refills | Status: AC

## 2023-10-17 MED ORDER — ROSUVASTATIN CALCIUM 10 MG PO TABS: 10.0000 mg | ORAL_TABLET | Freq: Every day | ORAL | 0 refills | Status: DC

## 2023-10-17 MED ORDER — ALBUTEROL SULFATE HFA 108 (90 BASE) MCG/ACT IN AERS: 2.0000 | INHALATION_SPRAY | Freq: Four times a day (QID) | RESPIRATORY_TRACT | 1 refills | Status: AC | PRN

## 2023-10-17 NOTE — Progress Notes (Signed)
 Results through MyChart

## 2024-01-02 ENCOUNTER — Other Ambulatory Visit: Payer: Self-pay | Admitting: Medical

## 2024-01-13 ENCOUNTER — Other Ambulatory Visit: Payer: Self-pay | Admitting: Medical

## 2024-10-18 ENCOUNTER — Encounter: Payer: Self-pay | Admitting: Medical
